# Patient Record
Sex: Female | Born: 1960 | Race: White | Hispanic: No | Marital: Single | Smoking: Never smoker
Health system: Southern US, Community
[De-identification: ages and names within clinical notes are randomized; demographics above are authoritative.]

## PROBLEM LIST (undated history)

## (undated) DIAGNOSIS — E559 Vitamin D deficiency, unspecified: Secondary | ICD-10-CM

## (undated) HISTORY — PX: APPENDECTOMY: SHX54

## (undated) HISTORY — DX: Vitamin D deficiency, unspecified: E55.9

---

## 2007-02-04 ENCOUNTER — Other Ambulatory Visit: Admission: RE | Admit: 2007-02-04 | Discharge: 2007-02-04 | Payer: Self-pay | Admitting: Obstetrics and Gynecology

## 2008-01-08 ENCOUNTER — Encounter (INDEPENDENT_AMBULATORY_CARE_PROVIDER_SITE_OTHER): Payer: Self-pay | Admitting: *Deleted

## 2008-02-05 ENCOUNTER — Other Ambulatory Visit: Admission: RE | Admit: 2008-02-05 | Discharge: 2008-02-05 | Payer: Self-pay | Admitting: Obstetrics and Gynecology

## 2008-02-22 ENCOUNTER — Ambulatory Visit: Payer: Self-pay | Admitting: Family Medicine

## 2008-02-22 DIAGNOSIS — M25559 Pain in unspecified hip: Secondary | ICD-10-CM | POA: Insufficient documentation

## 2008-04-01 ENCOUNTER — Ambulatory Visit: Payer: Self-pay | Admitting: Family Medicine

## 2008-04-01 DIAGNOSIS — D239 Other benign neoplasm of skin, unspecified: Secondary | ICD-10-CM | POA: Insufficient documentation

## 2008-06-17 ENCOUNTER — Encounter: Payer: Self-pay | Admitting: Family Medicine

## 2008-07-07 ENCOUNTER — Encounter: Payer: Self-pay | Admitting: Family Medicine

## 2009-08-29 ENCOUNTER — Encounter: Payer: Self-pay | Admitting: Family Medicine

## 2010-06-19 NOTE — Procedures (Signed)
Summary: Colonoscopy/[facility]  Colonoscopy/[facility]   Imported By: Lanelle Bal 09/06/2009 13:01:59  _____________________________________________________________________  External Attachment:    Type:   Image     Comment:   External Document

## 2011-03-21 LAB — HM PAP SMEAR

## 2013-04-21 ENCOUNTER — Encounter: Payer: Self-pay | Admitting: Obstetrics and Gynecology

## 2013-04-21 ENCOUNTER — Ambulatory Visit: Payer: Self-pay | Admitting: Obstetrics and Gynecology

## 2013-06-11 ENCOUNTER — Ambulatory Visit: Payer: Self-pay | Admitting: Obstetrics and Gynecology

## 2013-06-25 ENCOUNTER — Ambulatory Visit: Payer: Self-pay | Admitting: Obstetrics & Gynecology

## 2013-06-28 ENCOUNTER — Ambulatory Visit (INDEPENDENT_AMBULATORY_CARE_PROVIDER_SITE_OTHER): Payer: Federal, State, Local not specified - PPO | Admitting: Obstetrics and Gynecology

## 2013-06-28 ENCOUNTER — Encounter: Payer: Self-pay | Admitting: Obstetrics and Gynecology

## 2013-06-28 VITALS — BP 96/58 | HR 64 | Resp 16 | Ht 61.25 in | Wt 129.8 lb

## 2013-06-28 DIAGNOSIS — Z01419 Encounter for gynecological examination (general) (routine) without abnormal findings: Secondary | ICD-10-CM

## 2013-06-28 NOTE — Progress Notes (Signed)
GYNECOLOGY VISIT  PCP:  Dr Kelton Pillar  Referring provider:   HPI: 53 y.o.   Single  Caucasian  female   G0P0000 with Patient's last menstrual period was 07/03/2012.   here for   AEX Having aching at night in hip and knee - occurring for months.  Feels better when exercises.  Walking a mile.  Rare hot flashes and night sweats.  Less now.  Not on HRT now or in the past.   Has a healthy diet and exercises.   History of fibroids.   Hgb: PCP  Urine:PCP    GYNECOLOGIC HISTORY: Patient's last menstrual period was 07/03/2012. Sexually active:  no Partner preference: female Contraception:  none  Menopausal hormone therapy: none DES exposure: none   Blood transfusions:   none Sexually transmitted diseases: none   GYN Procedures:none   Mammogram:   Per patient 10/14 at [facility]              Pap:  03/21/11 WNL  History of abnormal pap smear:none     OB History   Grav Para Term Preterm Abortions TAB SAB Ect Mult Living   0 0 0 0 0 0 0 0 0 0        LIFESTYLE: Exercise:  Walk and resistance training             Tobacco: none Alcohol: 2-3 weekly Drug PJK:DTOI    OTHER HEALTH MAINTENANCE: Tetanus/TDap:with PCP 2013/14 Gardisil: none Influenza: never  Zostavax:never  Bone density: none Colonoscopy:2011 ? When to repeat-[facility]  Cholesterol check: 6/14 with PCP  Family History  Problem Relation Age of Onset  . Hypertension Mother   . Hypertension Father   . Thyroid disease Brother     Grave's disease  . Parkinson's disease Brother   . Heart disease Brother   . Breast cancer Mother 23    radiation    Patient Active Problem List   Diagnosis Date Noted  . MOLE 04/01/2008  . HIP PAIN, LEFT 02/22/2008   History reviewed. No pertinent past medical history.  History reviewed. No pertinent past surgical history.  ALLERGIES: Review of patient's allergies indicates no known allergies.  Current Outpatient Prescriptions  Medication Sig Dispense Refill  .  Cholecalciferol (VITAMIN D) 2000 UNITS tablet Take 2,000 Units by mouth daily.      . Omega-3 Fatty Acids (OMEGA 3 PO) Take by mouth. With CoQ10       No current facility-administered medications for this visit.     ROS:  Pertinent items are noted in HPI.  SOCIAL HISTORY:  Works in Chief Executive Officer at [employer].  Female relationship.  PHYSICAL EXAMINATION:    BP 96/58  Pulse 64  Resp 16  Ht 5' 1.25" (1.556 m)  Wt 129 lb 12.8 oz (58.877 kg)  BMI 24.32 kg/m2  LMP 07/03/2012   Wt Readings from Last 3 Encounters:  06/28/13 129 lb 12.8 oz (58.877 kg)  04/01/08 135 lb 2.1 oz (61.295 kg)  02/22/08 136 lb (61.689 kg)     Ht Readings from Last 3 Encounters:  06/28/13 5' 1.25" (1.556 m)    General appearance: alert, cooperative and appears stated age Head: Normocephalic, without obvious abnormality, atraumatic Neck: no adenopathy, supple, symmetrical, trachea midline and thyroid not enlarged, symmetric, no tenderness/mass/nodules Lungs: clear to auscultation bilaterally Breasts: Inspection negative, No nipple retraction or dimpling, No nipple discharge or bleeding, No axillary or supraclavicular adenopathy, Normal to palpation without dominant masses Heart: regular rate and rhythm Abdomen: soft, non-tender; no  masses,  no organomegaly Extremities: extremities normal, atraumatic, no cyanosis or edema Skin: Skin color, texture, turgor normal. No rashes or lesions Lymph nodes: Cervical, supraclavicular, and axillary nodes normal. No abnormal inguinal nodes palpated Neurologic: Grossly normal  Pelvic: External genitalia:  no lesions              Urethra:  normal appearing urethra with no masses, tenderness or lesions              Bartholins and Skenes: normal                 Vagina: normal appearing vagina with normal color and discharge, no lesions.  Used thin Lolita Patella.               Cervix: normal appearance              Pap and high risk HPV testing done: yes.            Bimanual  Exam:  Uterus:  uterus is normal size, shape, consistency and nontender.  Small 0.5 cm anterior LUS fibroid palpable.                                      Adnexa: normal adnexa in size, nontender and no masses                                      Rectovaginal: Confirms                                      Anus:  normal sphincter tone, no lesions  ASSESSMENT  Normal gynecologic exam. Menopausal female.  History of uterine fibroids.  Joint pain.   PLAN  Mammogram yearly.   Pap smear and high risk HPV testing performed.  Labs with PCP.  Patient will see PCP re joint pain.  Information to patient about menopause.  Return annually or prn   An After Visit Summary was printed and given to the patient.

## 2013-06-28 NOTE — Patient Instructions (Signed)

## 2013-06-30 LAB — IPS PAP TEST WITH HPV

## 2013-07-12 ENCOUNTER — Telehealth: Payer: Self-pay | Admitting: Obstetrics and Gynecology

## 2013-07-12 NOTE — Telephone Encounter (Signed)
Patient calling to review recent results.

## 2013-07-12 NOTE — Telephone Encounter (Signed)
Spoke with pt to advise her Pap was negative. Pt wasn't sure by the way it was worded, but she is appreciative.

## 2014-04-26 ENCOUNTER — Other Ambulatory Visit: Payer: Self-pay | Admitting: Physician Assistant

## 2014-04-26 ENCOUNTER — Ambulatory Visit
Admission: RE | Admit: 2014-04-26 | Discharge: 2014-04-26 | Disposition: A | Discharge status: Home / Self Care | Payer: Federal, State, Local not specified - PPO | Source: Ambulatory Visit | Attending: Physician Assistant | Admitting: Physician Assistant

## 2014-04-26 ENCOUNTER — Encounter (INDEPENDENT_AMBULATORY_CARE_PROVIDER_SITE_OTHER): Payer: Self-pay

## 2014-04-26 DIAGNOSIS — T1490XA Injury, unspecified, initial encounter: Secondary | ICD-10-CM

## 2014-12-07 ENCOUNTER — Ambulatory Visit (INDEPENDENT_AMBULATORY_CARE_PROVIDER_SITE_OTHER): Payer: Federal, State, Local not specified - PPO | Admitting: Obstetrics and Gynecology

## 2014-12-07 ENCOUNTER — Encounter: Payer: Self-pay | Admitting: Obstetrics and Gynecology

## 2014-12-07 VITALS — BP 122/60 | HR 80 | Resp 16 | Ht 61.0 in | Wt 138.0 lb

## 2014-12-07 DIAGNOSIS — Z01419 Encounter for gynecological examination (general) (routine) without abnormal findings: Secondary | ICD-10-CM

## 2014-12-07 NOTE — Patient Instructions (Signed)

## 2014-12-07 NOTE — Progress Notes (Signed)
54 y.o. G0P0000 Single Caucasian female here for annual exam.  No vaginal bleeding. Tolerable vasomotor symptoms. Not sexually active.   PCP: Kelton Pillar     Patient's last menstrual period was 07/03/2012.          Sexually active: No.  The current method of family planning is abstinence and post menopausal status.    Exercising: Yes.    walking daily Smoker:  no  Health Maintenance: Pap:  06/28/13 Neg. HR HPV:neg History of abnormal Pap:  no MMG:  04/23/14 - will get fax report  Colonoscopy: 08/29/09  Repeat 10 years  BMD:   Never  TDaP:  ~2013 with PCP - per pt Screening Labs:  Hb today: PCP, Urine today: PCP   reports that she has never smoked. She has never used smokeless tobacco. She reports that she drinks about 1.0 - 1.5 oz of alcohol per week. She reports that she does not use illicit drugs.  History reviewed. No pertinent past medical history.  History reviewed. No pertinent past surgical history.  Current Outpatient Prescriptions  Medication Sig Dispense Refill  . Cholecalciferol (VITAMIN D) 2000 UNITS tablet Take 2,000 Units by mouth daily.    . Cyanocobalamin (VITAMIN B 12 PO) Take by mouth.    . Omega-3 Fatty Acids (OMEGA 3 PO) Take by mouth. With CoQ10     No current facility-administered medications for this visit.    Family History  Problem Relation Age of Onset  . Hypertension Mother   . Hypertension Father   . Thyroid disease Brother     Grave's disease  . Parkinson's disease Brother   . Heart disease Brother   . Breast cancer Mother 42    radiation    Brother with CABG in his 48's  ROS:  Pertinent items are noted in HPI.  Otherwise, a comprehensive ROS was negative.  Exam:   BP 122/60 mmHg  Pulse 80  Resp 16  Ht 5\' 1"  (1.549 m)  Wt 138 lb (62.596 kg)  BMI 26.09 kg/m2  LMP 07/03/2012    General appearance: alert, cooperative and appears stated age Head: Normocephalic, without obvious abnormality, atraumatic Neck: no adenopathy, supple,  symmetrical, trachea midline and thyroid normal to inspection and palpation Lungs: clear to auscultation bilaterally Breasts: normal appearance, no masses or tenderness Heart: regular rate and rhythm Abdomen: soft, non-tender; bowel sounds normal; no masses,  no organomegaly Extremities: extremities normal, atraumatic, no cyanosis or edema Skin: Skin color, texture, turgor normal. No rashes or lesions Lymph nodes: Cervical, supraclavicular, and axillary nodes normal. No abnormal inguinal nodes palpated Neurologic: Grossly normal  Pelvic: External genitalia:  no lesions              Urethra:  normal appearing urethra with no masses, tenderness or lesions              Bartholins and Skenes: normal                 Vagina: atrophic vaginal mucosa              Cervix: no lesions              Pap taken: No. Bimanual Exam:  Uterus:  normal size, contour, position, consistency, mobility, non-tender and anteverted              Adnexa: normal adnexa and no mass, fullness, tenderness              Rectovaginal: Yes.  .  Confirms.  Anus:  normal sphincter tone, no lesions  Chaperone was present for exam.  Assessment:   Well woman visit with normal exam.   Plan: Yearly mammogram recommended after age 18. Due in December Recommended and reviewed self breast exam.  No pap this year Discussed Calcium, Vitamin D, regular exercise program including cardiovascular and weight bearing exercise. Labs performed.  No. Follow up annually and prn.   After visit summary provided.

## 2015-01-27 ENCOUNTER — Ambulatory Visit: Payer: Federal, State, Local not specified - PPO | Admitting: Obstetrics and Gynecology

## 2015-12-20 ENCOUNTER — Encounter: Payer: Self-pay | Admitting: Obstetrics and Gynecology

## 2015-12-20 ENCOUNTER — Ambulatory Visit: Payer: Federal, State, Local not specified - PPO | Admitting: Obstetrics and Gynecology

## 2015-12-20 VITALS — BP 100/60 | HR 80 | Resp 14 | Ht 61.0 in | Wt 131.0 lb

## 2015-12-20 DIAGNOSIS — Z01419 Encounter for gynecological examination (general) (routine) without abnormal findings: Secondary | ICD-10-CM

## 2015-12-20 NOTE — Patient Instructions (Signed)

## 2015-12-20 NOTE — Progress Notes (Signed)
55 y.o. G0P0000 SingleCaucasianF here for annual exam.  No vaginal bleeding. Not sexually active, currently. H/O female partners.    Patient's last menstrual period was 07/03/2012.          Sexually active: No.  The current method of family planning is post menopausal status.    Exercising: Yes.    walking/ boflex Smoker:  no  Health Maintenance: Pap:  06-28-13 WNL NEG HR HPV History of abnormal Pap:  no MMG:  2017 3D WNL per patient Colonoscopy: 07-18-09 WNL BMD:   Never TDaP:  2013 with her primary Gardasil: N/A   reports that she has never smoked. She has never used smokeless tobacco. She reports that she drinks about 2.4 oz of alcohol per week . 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  Mother passed in the last year. Sister in [place], brother with parkinson's also in [place].  No past medical history on file.  No past surgical history on file.  Current Outpatient Prescriptions  Medication Sig Dispense Refill  . Cholecalciferol (VITAMIN D) 2000 UNITS tablet Take 2,000 Units by mouth daily.    . Cyanocobalamin (VITAMIN B 12 PO) Take by mouth.    . Omega-3 Fatty Acids (OMEGA 3 PO) Take by mouth. With CoQ10     No current facility-administered medications for this visit.     Family History  Problem Relation Age of Onset  . Hypertension Mother   . Breast cancer Mother 90    radiation  . Hypertension Father   . Thyroid disease Brother     Grave's disease  . Parkinson's disease Brother   . Heart disease Brother     Review of Systems  Constitutional: Negative.   HENT: Negative.   Eyes: Negative.   Respiratory: Negative.   Cardiovascular: Negative.   Gastrointestinal: Negative.   Endocrine: Negative.   Genitourinary: Negative.   Musculoskeletal: Negative.   Skin: Negative.        New mole on right upper leg  Allergic/Immunologic: Negative.   Neurological: Negative.   Psychiatric/Behavioral: Negative.     Exam:   BP 100/60 (BP Location: Right Arm, Patient  Position: Sitting, Cuff Size: Normal)   Pulse 80   Resp 14   Ht 5\' 1"  (1.549 m)   Wt 131 lb (59.4 kg)   LMP 07/03/2012   BMI 24.75 kg/m   Weight change: @WEIGHTCHANGE @ Height:   Height: 5\' 1"  (154.9 cm)  Ht Readings from Last 3 Encounters:  12/20/15 5\' 1"  (1.549 m)  12/07/14 5\' 1"  (1.549 m)  06/28/13 5' 1.25" (1.556 m)    General appearance: alert, cooperative and appears stated age Head: Normocephalic, without obvious abnormality, atraumatic Neck: no adenopathy, supple, symmetrical, trachea midline and thyroid normal to inspection and palpation Lungs: clear to auscultation bilaterally Breasts: normal appearance, no masses or tenderness Heart: regular rate and rhythm Abdomen: soft, non-tender; bowel sounds normal; no masses,  no organomegaly Extremities: extremities normal, atraumatic, no cyanosis or edema Skin: Skin color, texture, turgor normal. No rashes or lesions Lymph nodes: Cervical, supraclavicular, and axillary nodes normal. No abnormal inguinal nodes palpated Neurologic: Grossly normal   Pelvic: External genitalia:  no lesions              Urethra:  normal appearing urethra with no masses, tenderness or lesions              Bartholins and Skenes: normal  Vagina: normal appearing vagina with normal color and discharge, no lesions              Cervix: no lesions               Bimanual Exam:  Uterus:  normal size, contour, position, consistency, mobility, non-tender              Adnexa: no mass, fullness, tenderness               Rectovaginal: Confirms               Anus:  normal sphincter tone, no lesions  Chaperone was present for exam.  A:  Well Woman with normal exam  New mole, will go for dermatology check  Vaginal atrophy, we discussed vaginal estrogen, she will call if she wants it (vagifem)  P:   Pap next year  Mammogram and colonoscopy are up to date  Discussed breast self awareness  Discussed calcium and vit D intake

## 2016-02-23 ENCOUNTER — Telehealth: Payer: Self-pay | Admitting: Obstetrics and Gynecology

## 2016-02-23 ENCOUNTER — Other Ambulatory Visit: Payer: Self-pay | Admitting: Obstetrics and Gynecology

## 2016-02-23 MED ORDER — ESTRADIOL 10 MCG VA TABS: ORAL_TABLET | VAGINAL | 11 refills | Status: DC

## 2016-02-23 NOTE — Telephone Encounter (Signed)
Patient calling to speak with nurse about a prescripton for hormone medication.

## 2016-02-23 NOTE — Telephone Encounter (Signed)
I sent a prescription for Vagifem to the patient's pharmacy of record.  I gave refills for a year.

## 2016-02-23 NOTE — Telephone Encounter (Signed)
Spoke with patient. Patient states that she would like to start on Vagifem at this time. Reports she spoke with Dr.Jertson about this at her aex visit in 12/2015. States that she told Dr.Jertson she would think about it and return call. Asking for rx to be sent to pharmacy on file. Advised I will speak with covering provider and return call. Patient is agreeable.  Dr.Silva, okay to send in Vagifem 10 mcg place 1 tablet vaginally daily for 2 weeks, then 1 tablet twice per week #[id number] dispense as 8 tablets after the first month?

## 2016-02-26 NOTE — Telephone Encounter (Signed)
Spoke with patient. Advised rx for Vagifem has been sent to pharmacy on file. Patient is agreeable and verbalzies understanding.  Cc: Dr.Jertson  Routing to [employer] as she sent in RX while Dr.Jertson was out. Patient agreeable to disposition. Will close encounter.

## 2016-04-07 ENCOUNTER — Observation Stay (HOSPITAL_COMMUNITY)
Admission: EM | Admit: 2016-04-07 | Discharge: 2016-04-08 | Disposition: A | Discharge status: Home / Self Care | Payer: Federal, State, Local not specified - PPO | Attending: General Surgery | Admitting: General Surgery

## 2016-04-07 ENCOUNTER — Emergency Department (HOSPITAL_COMMUNITY): Payer: Federal, State, Local not specified - PPO | Admitting: Anesthesiology

## 2016-04-07 ENCOUNTER — Encounter (HOSPITAL_COMMUNITY): Payer: Self-pay | Admitting: Emergency Medicine

## 2016-04-07 ENCOUNTER — Emergency Department (HOSPITAL_COMMUNITY): Payer: Federal, State, Local not specified - PPO

## 2016-04-07 ENCOUNTER — Encounter (HOSPITAL_COMMUNITY)
Admission: EM | Disposition: A | Discharge status: Home / Self Care | Payer: Self-pay | Source: Home / Self Care | Attending: Emergency Medicine

## 2016-04-07 DIAGNOSIS — Z79899 Other long term (current) drug therapy: Secondary | ICD-10-CM | POA: Insufficient documentation

## 2016-04-07 DIAGNOSIS — K3580 Unspecified acute appendicitis: Principal | ICD-10-CM | POA: Diagnosis present

## 2016-04-07 DIAGNOSIS — R109 Unspecified abdominal pain: Secondary | ICD-10-CM | POA: Diagnosis present

## 2016-04-07 DIAGNOSIS — Z79890 Hormone replacement therapy (postmenopausal): Secondary | ICD-10-CM | POA: Diagnosis not present

## 2016-04-07 DIAGNOSIS — K352 Acute appendicitis with generalized peritonitis, without abscess: Secondary | ICD-10-CM

## 2016-04-07 HISTORY — PX: LAPAROSCOPIC APPENDECTOMY: SHX408

## 2016-04-07 LAB — CBC
HCT: 41.6 % (ref 36.0–46.0)
Hemoglobin: 13.8 g/dL (ref 12.0–15.0)
MCH: 29.7 pg (ref 26.0–34.0)
MCHC: 33.2 g/dL (ref 30.0–36.0)
MCV: 89.5 fL (ref 78.0–100.0)
PLATELETS: 209 10*3/uL (ref 150–400)
RBC: 4.65 MIL/uL (ref 3.87–5.11)
RDW: 12.4 % (ref 11.5–15.5)
WBC: 14.1 10*3/uL — ABNORMAL HIGH (ref 4.0–10.5)

## 2016-04-07 LAB — DIFFERENTIAL
Basophils Absolute: 0 10*3/uL (ref 0.0–0.1)
Basophils Relative: 0 %
EOS PCT: 0 %
Eosinophils Absolute: 0 10*3/uL (ref 0.0–0.7)
LYMPHS PCT: 7 %
Lymphs Abs: 0.9 10*3/uL (ref 0.7–4.0)
MONO ABS: 0.7 10*3/uL (ref 0.1–1.0)
Monocytes Relative: 5 %
Neutro Abs: 12 10*3/uL — ABNORMAL HIGH (ref 1.7–7.7)
Neutrophils Relative %: 88 %

## 2016-04-07 LAB — URINALYSIS, ROUTINE W REFLEX MICROSCOPIC
BILIRUBIN URINE: NEGATIVE
GLUCOSE, UA: NEGATIVE mg/dL
HGB URINE DIPSTICK: NEGATIVE
KETONES UR: NEGATIVE mg/dL
Nitrite: NEGATIVE
PROTEIN: NEGATIVE mg/dL
Specific Gravity, Urine: 1.031 — ABNORMAL HIGH (ref 1.005–1.030)
pH: 5.5 (ref 5.0–8.0)

## 2016-04-07 LAB — COMPREHENSIVE METABOLIC PANEL
ALT: 33 U/L (ref 14–54)
AST: 49 U/L — AB (ref 15–41)
Albumin: 4.7 g/dL (ref 3.5–5.0)
Alkaline Phosphatase: 70 U/L (ref 38–126)
Anion gap: 7 (ref 5–15)
BUN: 15 mg/dL (ref 6–20)
CHLORIDE: 103 mmol/L (ref 101–111)
CO2: 25 mmol/L (ref 22–32)
CREATININE: 0.71 mg/dL (ref 0.44–1.00)
Calcium: 10 mg/dL (ref 8.9–10.3)
GFR calc Af Amer: >60 mL/min (ref >60)
GFR calc non Af Amer: >60 mL/min (ref >60)
GLUCOSE: 144 mg/dL — AB (ref 65–99)
Potassium: 4 mmol/L (ref 3.5–5.1)
SODIUM: 135 mmol/L (ref 135–145)
Total Bilirubin: 0.8 mg/dL (ref 0.3–1.2)
Total Protein: 7.6 g/dL (ref 6.5–8.1)

## 2016-04-07 LAB — URINE MICROSCOPIC-ADD ON

## 2016-04-07 LAB — LIPASE, BLOOD: LIPASE: 22 U/L (ref 11–51)

## 2016-04-07 SURGERY — APPENDECTOMY, LAPAROSCOPIC
Anesthesia: General

## 2016-04-07 MED ORDER — IOPAMIDOL (ISOVUE-300) INJECTION 61%
INTRAVENOUS | Status: AC
  Administered 2016-04-07: 21:00:00
  Filled 2016-04-07: qty 100

## 2016-04-07 MED ORDER — PROPOFOL 10 MG/ML IV BOLUS
INTRAVENOUS | Status: AC
  Filled 2016-04-07: qty 20

## 2016-04-07 MED ORDER — SODIUM CHLORIDE 0.9 % IV BOLUS (SEPSIS)
1000.0000 mL | Freq: Once | INTRAVENOUS | Status: AC
  Administered 2016-04-07: 1000 mL via INTRAVENOUS

## 2016-04-07 MED ORDER — ONDANSETRON 4 MG PO TBDP
4.0000 mg | ORAL_TABLET | Freq: Once | ORAL | Status: AC | PRN
  Administered 2016-04-07: 4 mg via ORAL
  Filled 2016-04-07: qty 1

## 2016-04-07 MED ORDER — METRONIDAZOLE IN NACL 5-0.79 MG/ML-% IV SOLN
500.0000 mg | Freq: Once | INTRAVENOUS | Status: AC
  Administered 2016-04-07: 500 mg via INTRAVENOUS
  Filled 2016-04-07: qty 100

## 2016-04-07 MED ORDER — MORPHINE SULFATE (PF) 4 MG/ML IV SOLN
4.0000 mg | INTRAVENOUS | Status: AC | PRN
  Administered 2016-04-07 (×2): 4 mg via INTRAVENOUS
  Filled 2016-04-07 (×2): qty 1

## 2016-04-07 MED ORDER — MORPHINE SULFATE (PF) 4 MG/ML IV SOLN: 4.0000 mg | Freq: Once | INTRAVENOUS | Status: DC

## 2016-04-07 MED ORDER — SUCCINYLCHOLINE CHLORIDE 200 MG/10ML IV SOSY
PREFILLED_SYRINGE | INTRAVENOUS | Status: AC
  Filled 2016-04-07: qty 10

## 2016-04-07 MED ORDER — DEXTROSE 5 % IV SOLN
2.0000 g | Freq: Once | INTRAVENOUS | Status: AC
  Administered 2016-04-07: 2 g via INTRAVENOUS
  Filled 2016-04-07: qty 2

## 2016-04-07 MED ORDER — SODIUM CHLORIDE 0.9 % IV SOLN
INTRAVENOUS | Status: DC | PRN
  Administered 2016-04-07: 20:00:00 via INTRAVENOUS

## 2016-04-07 MED ORDER — BUPIVACAINE HCL (PF) 0.5 % IJ SOLN
INTRAMUSCULAR | Status: AC
  Filled 2016-04-07: qty 30

## 2016-04-07 MED ORDER — LIDOCAINE 2% (20 MG/ML) 5 ML SYRINGE
INTRAMUSCULAR | Status: AC
  Filled 2016-04-07: qty 5

## 2016-04-07 MED ORDER — IOPAMIDOL (ISOVUE-300) INJECTION 61%
100.0000 mL | Freq: Once | INTRAVENOUS | Status: AC | PRN
  Administered 2016-04-07: 100 mL via INTRAVENOUS

## 2016-04-07 MED ORDER — MIDAZOLAM HCL 2 MG/2ML IJ SOLN
INTRAMUSCULAR | Status: AC
  Filled 2016-04-07: qty 2

## 2016-04-07 MED ORDER — ROCURONIUM BROMIDE 50 MG/5ML IV SOSY
PREFILLED_SYRINGE | INTRAVENOUS | Status: AC
  Filled 2016-04-07: qty 5

## 2016-04-07 MED ORDER — FENTANYL CITRATE (PF) 100 MCG/2ML IJ SOLN
INTRAMUSCULAR | Status: AC
  Filled 2016-04-07: qty 2

## 2016-04-07 MED ORDER — METRONIDAZOLE IN NACL 5-0.79 MG/ML-% IV SOLN
INTRAVENOUS | Status: AC
  Filled 2016-04-07: qty 100

## 2016-04-07 SURGICAL SUPPLY — 45 items
APPLIER CLIP 5 13 M/L LIGAMAX5 (MISCELLANEOUS) ×3
APPLIER CLIP ROT 10 11.4 M/L (STAPLE)
BENZOIN TINCTURE PRP APPL 2/3 (GAUZE/BANDAGES/DRESSINGS) ×3 IMPLANT
CABLE HIGH FREQUENCY MONO STRZ (ELECTRODE) ×3 IMPLANT
CHLORAPREP W/TINT 26ML (MISCELLANEOUS) ×3 IMPLANT
CLIP APPLIE 5 13 M/L LIGAMAX5 (MISCELLANEOUS) ×1 IMPLANT
CLIP APPLIE ROT 10 11.4 M/L (STAPLE) IMPLANT
CLOSURE WOUND 1/2 X4 (GAUZE/BANDAGES/DRESSINGS) ×1
COVER SURGICAL LIGHT HANDLE (MISCELLANEOUS) ×3 IMPLANT
CUTTER FLEX LINEAR 45M (STAPLE) ×3 IMPLANT
DECANTER SPIKE VIAL GLASS SM (MISCELLANEOUS) ×3 IMPLANT
DRAIN CHANNEL 19F RND (DRAIN) IMPLANT
DRAPE LAPAROSCOPIC ABDOMINAL (DRAPES) ×3 IMPLANT
DRSG TEGADERM 2-3/8X2-3/4 SM (GAUZE/BANDAGES/DRESSINGS) ×3 IMPLANT
ELECT REM PT RETURN 9FT ADLT (ELECTROSURGICAL) ×3
ELECTRODE REM PT RTRN 9FT ADLT (ELECTROSURGICAL) ×1 IMPLANT
ENDOLOOP SUT PDS II  0 18 (SUTURE)
ENDOLOOP SUT PDS II 0 18 (SUTURE) IMPLANT
EVACUATOR SILICONE 100CC (DRAIN) IMPLANT
GAUZE SPONGE 2X2 8PLY STRL LF (GAUZE/BANDAGES/DRESSINGS) ×1 IMPLANT
GLOVE ECLIPSE 8.0 STRL XLNG CF (GLOVE) ×3 IMPLANT
GLOVE INDICATOR 8.0 STRL GRN (GLOVE) ×3 IMPLANT
GOWN STRL REUS W/TWL XL LVL3 (GOWN DISPOSABLE) ×6 IMPLANT
HEMOSTAT SNOW SURGICEL 2X4 (HEMOSTASIS) ×3 IMPLANT
IRRIG SUCT STRYKERFLOW 2 WTIP (MISCELLANEOUS) ×3
IRRIGATION SUCT STRKRFLW 2 WTP (MISCELLANEOUS) ×1 IMPLANT
KIT BASIN OR (CUSTOM PROCEDURE TRAY) ×3 IMPLANT
POUCH SPECIMEN RETRIEVAL 10MM (ENDOMECHANICALS) ×3 IMPLANT
RELOAD 45 VASCULAR/THIN (ENDOMECHANICALS) IMPLANT
RELOAD STAPLE TA45 3.5 REG BLU (ENDOMECHANICALS) ×3 IMPLANT
SCISSORS LAP 5X35 DISP (ENDOMECHANICALS) ×3 IMPLANT
SHEARS HARMONIC ACE PLUS 36CM (ENDOMECHANICALS) ×3 IMPLANT
SLEEVE XCEL OPT CAN 5 100 (ENDOMECHANICALS) ×3 IMPLANT
SPONGE GAUZE 2X2 STER 10/PKG (GAUZE/BANDAGES/DRESSINGS) ×2
STRIP CLOSURE SKIN 1/2X4 (GAUZE/BANDAGES/DRESSINGS) ×2 IMPLANT
SUT ETHILON 3 0 PS 1 (SUTURE) IMPLANT
SUT MNCRL AB 4-0 PS2 18 (SUTURE) ×3 IMPLANT
TOWEL OR 17X26 10 PK STRL BLUE (TOWEL DISPOSABLE) ×3 IMPLANT
TOWEL OR NON WOVEN STRL DISP B (DISPOSABLE) ×3 IMPLANT
TRAY FOLEY W/METER SILVER 14FR (SET/KITS/TRAYS/PACK) ×3 IMPLANT
TRAY FOLEY W/METER SILVER 16FR (SET/KITS/TRAYS/PACK) IMPLANT
TRAY LAPAROSCOPIC (CUSTOM PROCEDURE TRAY) ×3 IMPLANT
TROCAR BLADELESS OPT 5 100 (ENDOMECHANICALS) ×3 IMPLANT
TROCAR XCEL BLUNT TIP 100MML (ENDOMECHANICALS) ×3 IMPLANT
TUBING INSUF HEATED (TUBING) ×3 IMPLANT

## 2016-04-07 NOTE — ED Notes (Signed)
Dr. Zella Richer, general surgeon, at bedside.

## 2016-04-07 NOTE — ED Provider Notes (Signed)
[place] DEPT Provider Note   CSN: [id number] Arrival date & time: 04/07/16  [place]     History   Chief Complaint Chief Complaint  Patient presents with  . Abdominal Pain    HPI Crystal Mcguire is a 55 y.o. female.  HPI 55 year old female who presents with abdominal pain. She is otherwise healthy and has no prior abdominal surgeries. States sudden onset of left upper quadrant abdominal pain at around 1 PM today at rest. Since then pain has been constant, worsening, and now traveling diffusely across her abdomen and now into the left lower quadrant. Has had multiple episodes of nonbilious and nonbloody emesis. No diarrhea or constipation. No dysuria, urinary frequency or hematuria. With subjective fevers and chills. Has not had pain like this in the past. No prior history of post prandial abdominal pain. She did not take any medications and denies any NSAID usage. History reviewed. No pertinent past medical history.  Patient Active Problem List   Diagnosis Date Noted  . MOLE 04/01/2008    History reviewed. No pertinent surgical history.  OB History    Gravida Para Term Preterm AB Living   0 0 0 0 0 0   SAB TAB Ectopic Multiple Live Births   0 0 0 0         Home Medications    Prior to Admission medications   Medication Sig Start Date End Date Taking? Authorizing Provider  Cyanocobalamin (VITAMIN B 12 PO) Take 1 tablet by mouth daily.    Yes Historical Provider, MD  Estradiol 10 MCG TABS vaginal tablet Place one tablet (10 mcg) in vagina at hs for 2 weeks. Then place one tablet in vagina at hs twice a week. Patient taking differently: Place one tablet (10 mcg) in vagina at hs for 2 weeks. Then place one tablet (38mcg) in vagina at hs twice a week. 02/23/16  Yes [place], MD  Omega-3 Fatty Acids (OMEGA 3 PO) Take 1 tablet by mouth daily. With CoQ10   Yes Historical Provider, MD  Vitamin D, Ergocalciferol, (DRISDOL) 50000 units CAPS capsule Take 5,000  Units by mouth every 7 (seven) days.  03/31/16  Yes Historical Provider, MD    Family History Family History  Problem Relation Age of Onset  . Hypertension Mother   . Breast cancer Mother 56    radiation  . Hypertension Father   . Thyroid disease Brother     Grave's disease  . Parkinson's disease Brother   . Heart disease Brother     Social History Social History  Substance Use Topics  . Smoking status: Never Smoker  . Smokeless tobacco: Never Used  . Alcohol use 2.4 oz/week    4 Standard drinks or equivalent per week     Allergies   Patient has no known allergies.   Review of Systems Review of Systems 10/14 systems reviewed and are negative other than those stated in the HPI   Physical Exam Updated Vital Signs BP 109/64   Pulse 106   Temp (S) 100.6 F (38.1 C) (Oral)   Resp 18   Ht 5\' 5"  (1.651 m)   Wt 131 lb (59.4 kg)   LMP 07/03/2012   SpO2 95%   BMI 21.80 kg/m   Physical Exam Physical Exam  Nursing note and vitals reviewed. Constitutional: non-toxic, and in no acute distress Head: Normocephalic and atraumatic.  Mouth/Throat: Oropharynx is clear and moist.  Neck: Normal range of motion. Neck supple.  Cardiovascular: Tachycardic rate  and regular rhythm.   Pulmonary/Chest: Effort normal and breath sounds normal.  Abdominal: Soft. No distension. Diffusely tenderness, worse in left hemi-abdomen. There is guarding. No rebound. Musculoskeletal: Normal range of motion.  Neurological: Alert, no facial droop, fluent speech, moves all extremities symmetrically Skin: Skin is warm and dry.  Psychiatric: Cooperative   ED Treatments / Results  Labs (all labs ordered are listed, but only abnormal results are displayed) Labs Reviewed  COMPREHENSIVE METABOLIC PANEL - Abnormal; Notable for the following:       Result Value   Glucose, Bld 144 (*)    AST 49 (*)    All other components within normal limits  CBC - Abnormal; Notable for the following:    WBC  14.1 (*)    All other components within normal limits  URINALYSIS, ROUTINE W REFLEX MICROSCOPIC (NOT AT [facility]) - Abnormal; Notable for the following:    Specific Gravity, Urine 1.031 (*)    Leukocytes, UA SMALL (*)    All other components within normal limits  DIFFERENTIAL - Abnormal; Notable for the following:    Neutro Abs 12.0 (*)    All other components within normal limits  URINE MICROSCOPIC-ADD ON - Abnormal; Notable for the following:    Squamous Epithelial / LPF 6-30 (*)    Bacteria, UA FEW (*)    All other components within normal limits  LIPASE, BLOOD    EKG  EKG Interpretation None       Radiology Ct Abdomen Pelvis W Contrast  Result Date: 04/07/2016 CLINICAL DATA:  55 year old female with abdominal pain since noon. Nausea and vomiting. Initial encounter. EXAM: CT ABDOMEN AND PELVIS WITH CONTRAST TECHNIQUE: Multidetector CT imaging of the abdomen and pelvis was performed using the standard protocol following bolus administration of intravenous contrast. CONTRAST:  185mL ISOVUE-300 IOPAMIDOL (ISOVUE-300) INJECTION 61% COMPARISON:  None. FINDINGS: Lower chest: No pericardial or pleural effusion. Mild right lung base atelectasis. No upper abdominal free air. Hepatobiliary: Negative aside from borderline to mild periportal edema. Pancreas: Negative. Spleen: Negative; several subtle sub cm low-density areas occasionally noted including series 2, image 20, which appear benign and inconsequential. Adrenals/Urinary Tract: Negative adrenal glands. Bilateral renal enhancement and contrast excretion is normal. Unremarkable urinary bladder. Stomach/Bowel: Negative rectum. Distal sigmoid colon is decompressed. Redundant proximal sigmoid mildly gas distended. Decompressed descending colon. Redundant gas-filled splenic flexure and transverse colon. Gas and stool in the right colon. The cecum is located in the pelvis near midline. Negative terminal ileum. It appears the appendix arises along the  posterior wall of the cecum projecting toward the sacrum. There might be small isodense fecaliths at the base of the appendix (coronal image 56) while the more distal appendix appears dilated up to 12 mm and contains a combination of gas and fluid. The appendix is inseparable here from the distal sigmoid colon. There might be trace free fluid along the course of the appendix. No definite extraluminal gas. No pneumoperitoneum. The distal small bowel is decompressed. There is trace oral contrast mixed with gas in the proximal small bowel, some of which is mildly prominent. Small volume fluid in the stomach. Decompressed duodenum. Vascular/Lymphatic: Mild Calcified aortic atherosclerosis. Major arterial structures are patent. Portal venous system is patent. No lymphadenopathy. Reproductive: Multiple rounded uterine masses ranging from 2.5-4.1 cm. The largest located in the lower uterine segment has speckled calcification, and most likely these are multiple intramural fibroids. Negative adnexa. Other: No pelvic free fluid. Musculoskeletal: Lumbosacral junction disc and endplate degeneration. No acute osseous abnormality  identified. IMPRESSION: 1. Suspicion of early acute appendicitis with abnormal appearance of the midportion of the appendix which appears dilated with possible trace free fluid. Note that the appendix arises along the posterior wall of the cecum which is located near midline in the pelvis. The midportion of the appendix is inseparable from the distal sigmoid colon. If the clinical course is equivocal then administration of oral contrast and repeat CT abdomen and pelvis (IV contrast preferred) in 12 hours would be recommended. 2. No other acute or inflammatory process identified in the abdomen or pelvis. 3. Fibroid uterus. Electronically Signed   By: Genevie Ann M.D.   On: 04/07/2016 21:23    Procedures Procedures (including critical care time)  Medications Ordered in ED Medications  morphine 4 MG/ML  injection 4 mg (4 mg Intravenous Given 04/07/16 2033)  cefTRIAXone (ROCEPHIN) 2 g in dextrose 5 % 50 mL IVPB (2 g Intravenous New Bag/Given 04/07/16 2200)    And  metroNIDAZOLE (FLAGYL) IVPB 500 mg (not administered)  ondansetron (ZOFRAN-ODT) disintegrating tablet 4 mg (4 mg Oral Given 04/07/16 1855)  sodium chloride 0.9 % bolus 1,000 mL (0 mLs Intravenous Stopped 04/07/16 2206)  iopamidol (ISOVUE-300) 61 % injection 100 mL (100 mLs Intravenous Contrast Given 04/07/16 2051)  iopamidol (ISOVUE-300) 61 % injection (  Contrast Given 04/07/16 2046)     Initial Impression / Assessment and Plan / ED Course  I have reviewed the triage vital signs and the nursing notes.  Pertinent labs & imaging results that were available during my care of the patient were reviewed by me and considered in my medical decision making (see chart for details).  Clinical Course     55 year old female who presents with generalized abdominal pain, nausea and vomiting. Despite fever 100.6 Fahrenheit here in the ED, is tachycardic. Normotensive and is overall nontoxic in appearance. Diffusely tender on abdominal exam with guarding. CT abdomen and pelvis obtained, visualized, and shows evidence of mild early appendicitis. With leukocytosis of 14 on blood work. Started on ceftriaxone and Flagyl. Discussed with Dr. Zella Richer who will admit for ongoing management.  Final Clinical Impressions(s) / ED Diagnoses   Final diagnoses:  Acute appendicitis with generalized peritonitis    New Prescriptions New Prescriptions   No medications on file     Forde Dandy, MD 04/07/16 2218

## 2016-04-07 NOTE — H&P (Signed)
Crystal Mcguire is an 55 y.o. female.   Chief Complaint:   Abdominal pain HPI:  She was in her usual state of health until about 12:30 today when she developed some epigastric pain. She tried some Pepto-Bismol which did not help the pain. The pain then radiated down to the lower abdomen and worsened. She did have some vomiting and nausea at home. She presented to the emergency department for evaluation. She was noted to have a leukocytosis. CT scan demonstrated findings concerning for acute appendicitis with a dilated appendix with some surrounding free fluid. We were asked to see her because of this. She is otherwise healthy.  History reviewed. No pertinent past medical history.  History reviewed. No pertinent surgical history.  Family History  Problem Relation Age of Onset  . Hypertension Mother   . Breast cancer Mother 26    radiation  . Hypertension Father   . Thyroid disease Brother     Grave's disease  . Parkinson's disease Brother   . Heart disease Brother    Social History:  reports that she has never smoked. She has never used smokeless tobacco. She reports that she drinks about 2.4 oz of alcohol per week . She reports that she does not use drugs.  Allergies: No Known Allergies  Prior to Admission medications   Medication Sig Start Date End Date Taking? Authorizing Provider  Cyanocobalamin (VITAMIN B 12 PO) Take 1 tablet by mouth daily.    Yes Historical Provider, MD  Estradiol 10 MCG TABS vaginal tablet Place one tablet (10 mcg) in vagina at hs for 2 weeks. Then place one tablet in vagina at hs twice a week. Patient taking differently: Place one tablet (10 mcg) in vagina at hs for 2 weeks. Then place one tablet (51mg) in vagina at hs twice a week. 02/23/16  Yes BDavison MD  Omega-3 Fatty Acids (OMEGA 3 PO) Take 1 tablet by mouth daily. With CoQ10   Yes Historical Provider, MD  Vitamin D, Ergocalciferol, (DRISDOL) 50000 units CAPS capsule Take 5,000 Units by  mouth every 7 (seven) days.  03/31/16  Yes Historical Provider, MD     Results for orders placed or performed during the hospital encounter of 04/07/16 (from the past 48 hour(s))  Lipase, blood     Status: None   Collection Time: 04/07/16  7:02 PM  Result Value Ref Range   Lipase 22 11 - 51 U/L  Comprehensive metabolic panel     Status: Abnormal   Collection Time: 04/07/16  7:02 PM  Result Value Ref Range   Sodium 135 135 - 145 mmol/L   Potassium 4.0 3.5 - 5.1 mmol/L   Chloride 103 101 - 111 mmol/L   CO2 25 22 - 32 mmol/L   Glucose, Bld 144 (H) 65 - 99 mg/dL   BUN 15 6 - 20 mg/dL   Creatinine, Ser 0.71 0.44 - 1.00 mg/dL   Calcium 10.0 8.9 - 10.3 mg/dL   Total Protein 7.6 6.5 - 8.1 g/dL   Albumin 4.7 3.5 - 5.0 g/dL   AST 49 (H) 15 - 41 U/L   ALT 33 14 - 54 U/L   Alkaline Phosphatase 70 38 - 126 U/L   Total Bilirubin 0.8 0.3 - 1.2 mg/dL   GFR calc non Af Amer >60 >60 mL/min   GFR calc Af Amer >60 >60 mL/min    Comment: (NOTE) The eGFR has been calculated using the CKD EPI equation. This calculation has not been  validated in all clinical situations. eGFR's persistently <60 mL/min signify possible Chronic Kidney Disease.    Anion gap 7 5 - 15  CBC     Status: Abnormal   Collection Time: 04/07/16  7:02 PM  Result Value Ref Range   WBC 14.1 (H) 4.0 - 10.5 K/uL   RBC 4.65 3.87 - 5.11 MIL/uL   Hemoglobin 13.8 12.0 - 15.0 g/dL   HCT 41.6 36.0 - 46.0 %   MCV 89.5 78.0 - 100.0 fL   MCH 29.7 26.0 - 34.0 pg   MCHC 33.2 30.0 - 36.0 g/dL   RDW 12.4 11.5 - 15.5 %   Platelets 209 150 - 400 K/uL  Differential     Status: Abnormal   Collection Time: 04/07/16  7:02 PM  Result Value Ref Range   Neutrophils Relative % 88 %   Neutro Abs 12.0 (H) 1.7 - 7.7 K/uL   Lymphocytes Relative 7 %   Lymphs Abs 0.9 0.7 - 4.0 K/uL   Monocytes Relative 5 %   Monocytes Absolute 0.7 0.1 - 1.0 K/uL   Eosinophils Relative 0 %   Eosinophils Absolute 0.0 0.0 - 0.7 K/uL   Basophils Relative 0 %    Basophils Absolute 0.0 0.0 - 0.1 K/uL  Urinalysis, Routine w reflex microscopic     Status: Abnormal   Collection Time: 04/07/16  9:04 PM  Result Value Ref Range   Color, Urine YELLOW YELLOW   APPearance CLEAR CLEAR   Specific Gravity, Urine 1.031 (H) 1.005 - 1.030   pH 5.5 5.0 - 8.0   Glucose, UA NEGATIVE NEGATIVE mg/dL   Hgb urine dipstick NEGATIVE NEGATIVE   Bilirubin Urine NEGATIVE NEGATIVE   Ketones, ur NEGATIVE NEGATIVE mg/dL   Protein, ur NEGATIVE NEGATIVE mg/dL   Nitrite NEGATIVE NEGATIVE   Leukocytes, UA SMALL (A) NEGATIVE  Urine microscopic-add on     Status: Abnormal   Collection Time: 04/07/16  9:04 PM  Result Value Ref Range   Squamous Epithelial / LPF 6-30 (A) NONE SEEN   WBC, UA 6-30 0 - 5 WBC/hpf   RBC / HPF 0-5 0 - 5 RBC/hpf   Bacteria, UA FEW (A) NONE SEEN   Ct Abdomen Pelvis W Contrast  Result Date: 04/07/2016 CLINICAL DATA:  55 year old female with abdominal pain since noon. Nausea and vomiting. Initial encounter. EXAM: CT ABDOMEN AND PELVIS WITH CONTRAST TECHNIQUE: Multidetector CT imaging of the abdomen and pelvis was performed using the standard protocol following bolus administration of intravenous contrast. CONTRAST:  136m ISOVUE-300 IOPAMIDOL (ISOVUE-300) INJECTION 61% COMPARISON:  None. FINDINGS: Lower chest: No pericardial or pleural effusion. Mild right lung base atelectasis. No upper abdominal free air. Hepatobiliary: Negative aside from borderline to mild periportal edema. Pancreas: Negative. Spleen: Negative; several subtle sub cm low-density areas occasionally noted including series 2, image 20, which appear benign and inconsequential. Adrenals/Urinary Tract: Negative adrenal glands. Bilateral renal enhancement and contrast excretion is normal. Unremarkable urinary bladder. Stomach/Bowel: Negative rectum. Distal sigmoid colon is decompressed. Redundant proximal sigmoid mildly gas distended. Decompressed descending colon. Redundant gas-filled splenic flexure  and transverse colon. Gas and stool in the right colon. The cecum is located in the pelvis near midline. Negative terminal ileum. It appears the appendix arises along the posterior wall of the cecum projecting toward the sacrum. There might be small isodense fecaliths at the base of the appendix (coronal image 56) while the more distal appendix appears dilated up to 12 mm and contains a combination of gas and fluid. The  appendix is inseparable here from the distal sigmoid colon. There might be trace free fluid along the course of the appendix. No definite extraluminal gas. No pneumoperitoneum. The distal small bowel is decompressed. There is trace oral contrast mixed with gas in the proximal small bowel, some of which is mildly prominent. Small volume fluid in the stomach. Decompressed duodenum. Vascular/Lymphatic: Mild Calcified aortic atherosclerosis. Major arterial structures are patent. Portal venous system is patent. No lymphadenopathy. Reproductive: Multiple rounded uterine masses ranging from 2.5-4.1 cm. The largest located in the lower uterine segment has speckled calcification, and most likely these are multiple intramural fibroids. Negative adnexa. Other: No pelvic free fluid. Musculoskeletal: Lumbosacral junction disc and endplate degeneration. No acute osseous abnormality identified. IMPRESSION: 1. Suspicion of early acute appendicitis with abnormal appearance of the midportion of the appendix which appears dilated with possible trace free fluid. Note that the appendix arises along the posterior wall of the cecum which is located near midline in the pelvis. The midportion of the appendix is inseparable from the distal sigmoid colon. If the clinical course is equivocal then administration of oral contrast and repeat CT abdomen and pelvis (IV contrast preferred) in 12 hours would be recommended. 2. No other acute or inflammatory process identified in the abdomen or pelvis. 3. Fibroid uterus. Electronically  Signed   By: Genevie Ann M.D.   On: 04/07/2016 21:23    Review of Systems  Constitutional: Positive for chills and fever. Negative for weight loss.  HENT: Negative for congestion, sinus pain and sore throat.   Respiratory: Negative for shortness of breath.   Cardiovascular: Negative for chest pain.  Gastrointestinal: Positive for abdominal pain and nausea. Negative for blood in stool.  Genitourinary: Negative for dysuria and hematuria.  Musculoskeletal: Negative for back pain.  Neurological: Negative for focal weakness and seizures.  Endo/Heme/Allergies:       No bleeding disorders.    Blood pressure 107/62, pulse 100, temperature (S) 100.6 F (38.1 C), temperature source Oral, resp. rate 18, height _0  (1.651 m), weight 59.4 kg (131 lb), last menstrual period 07/03/2012, SpO2 92 %. Physical Exam  Constitutional: She appears well-developed and well-nourished. No distress.  HENT:  Head: Normocephalic and atraumatic.  Eyes: EOM are normal. Scleral icterus is present.  Neck: Neck supple.  No mass  Cardiovascular:  Increased rate, regular rhythm.  Respiratory: Effort normal.  GI: Soft. She exhibits no mass. There is tenderness (lower midline and right lower quadr). There is no guarding.  Musculoskeletal: She exhibits no edema or deformity.  Neurological: She is alert.  Skin: She is not diaphoretic.  Increased warmth  Psychiatric: She has a normal mood and affect. Her behavior is normal.     Assessment/Plan Acute appendicitis-she has received intravenous antibiotics in the emergency room.  Plan laparoscopic possible open appendectomy. I have discussed the procedure and risks of appendectomy. The risks include but are not limited to bleeding, infection, wound problems, anesthesia, injury to intra-abdominal organs, possibility of postoperative ileus. She seems to understand and agrees with the plan.  Odis Hollingshead, MD 04/07/2016, 11:02 PM

## 2016-04-07 NOTE — Anesthesia Preprocedure Evaluation (Signed)
Anesthesia Evaluation  Patient identified by MRN, date of birth, ID band Patient awake    Reviewed: Allergy & Precautions, NPO status , Patient's Chart, lab work & pertinent test results  Airway Mallampati: II  TM Distance: >3 FB Neck ROM: Full    Dental no notable dental hx.    Pulmonary neg pulmonary ROS,    Pulmonary exam normal breath sounds clear to auscultation       Cardiovascular negative cardio ROS Normal cardiovascular exam Rhythm:Regular Rate:Normal     Neuro/Psych negative neurological ROS  negative psych ROS   GI/Hepatic negative GI ROS, Neg liver ROS,   Endo/Other  negative endocrine ROS  Renal/GU negative Renal ROS  negative genitourinary   Musculoskeletal negative musculoskeletal ROS (+)   Abdominal   Peds negative pediatric ROS (+)  Hematology negative hematology ROS (+)   Anesthesia Other Findings   Reproductive/Obstetrics negative OB ROS                             Anesthesia Physical Anesthesia Plan  ASA: I and emergent  Anesthesia Plan: General   Post-op Pain Management:    Induction: Intravenous, Rapid sequence and Cricoid pressure planned  Airway Management Planned: Oral ETT  Additional Equipment:   Intra-op Plan:   Post-operative Plan: Extubation in OR  Informed Consent: I have reviewed the patients History and Physical, chart, labs and discussed the procedure including the risks, benefits and alternatives for the proposed anesthesia with the patient or authorized representative who has indicated his/her understanding and acceptance.   Dental advisory given  Plan Discussed with: CRNA  Anesthesia Plan Comments:         Anesthesia Quick Evaluation

## 2016-04-07 NOTE — ED Triage Notes (Signed)
Patient reports abdominal pain starting around noon. Reports nausea, vomiting. Denies diarrhea. Patient took pepto bismol at home without relief.

## 2016-04-08 ENCOUNTER — Encounter (HOSPITAL_COMMUNITY): Payer: Self-pay | Admitting: General Surgery

## 2016-04-08 ENCOUNTER — Encounter (INDEPENDENT_AMBULATORY_CARE_PROVIDER_SITE_OTHER): Payer: Self-pay | Admitting: General Surgery

## 2016-04-08 DIAGNOSIS — K3580 Unspecified acute appendicitis: Secondary | ICD-10-CM | POA: Diagnosis present

## 2016-04-08 MED ORDER — PHENYLEPHRINE 40 MCG/ML (10ML) SYRINGE FOR IV PUSH (FOR BLOOD PRESSURE SUPPORT)
PREFILLED_SYRINGE | INTRAVENOUS | Status: AC
  Filled 2016-04-08: qty 10

## 2016-04-08 MED ORDER — SUGAMMADEX SODIUM 200 MG/2ML IV SOLN
INTRAVENOUS | Status: DC | PRN
  Administered 2016-04-08: 200 mg via INTRAVENOUS

## 2016-04-08 MED ORDER — HYDROCODONE-ACETAMINOPHEN 5-325 MG PO TABS: 1.0000 | ORAL_TABLET | ORAL | Status: DC | PRN

## 2016-04-08 MED ORDER — MORPHINE SULFATE (PF) 10 MG/ML IV SOLN: 2.0000 mg | INTRAVENOUS | Status: DC | PRN

## 2016-04-08 MED ORDER — PROPOFOL 10 MG/ML IV BOLUS
INTRAVENOUS | Status: DC | PRN
  Administered 2016-04-07: 160 mg via INTRAVENOUS

## 2016-04-08 MED ORDER — PHENYLEPHRINE HCL 10 MG/ML IJ SOLN
INTRAMUSCULAR | Status: DC | PRN
  Administered 2016-04-08 (×2): 40 ug via INTRAVENOUS

## 2016-04-08 MED ORDER — LIDOCAINE 2% (20 MG/ML) 5 ML SYRINGE
INTRAMUSCULAR | Status: DC | PRN
  Administered 2016-04-07: 100 mg via INTRAVENOUS

## 2016-04-08 MED ORDER — BUPIVACAINE HCL (PF) 0.5 % IJ SOLN
INTRAMUSCULAR | Status: DC | PRN
  Administered 2016-04-08: 30 mL

## 2016-04-08 MED ORDER — PROMETHAZINE HCL 25 MG/ML IJ SOLN: 6.2500 mg | INTRAMUSCULAR | Status: DC | PRN

## 2016-04-08 MED ORDER — MEPERIDINE HCL 50 MG/ML IJ SOLN: 6.2500 mg | INTRAMUSCULAR | Status: DC | PRN

## 2016-04-08 MED ORDER — SUCCINYLCHOLINE CHLORIDE 200 MG/10ML IV SOSY
PREFILLED_SYRINGE | INTRAVENOUS | Status: DC | PRN
  Administered 2016-04-07: 100 mg via INTRAVENOUS

## 2016-04-08 MED ORDER — HYDROMORPHONE HCL 1 MG/ML IJ SOLN: 0.2500 mg | INTRAMUSCULAR | Status: DC | PRN

## 2016-04-08 MED ORDER — MIDAZOLAM HCL 5 MG/5ML IJ SOLN
INTRAMUSCULAR | Status: DC | PRN
  Administered 2016-04-07: 2 mg via INTRAVENOUS

## 2016-04-08 MED ORDER — KCL IN DEXTROSE-NACL 20-5-0.9 MEQ/L-%-% IV SOLN
INTRAVENOUS | Status: DC
  Administered 2016-04-08: 100 mL/h via INTRAVENOUS
  Filled 2016-04-08 (×2): qty 1000

## 2016-04-08 MED ORDER — LACTATED RINGERS IR SOLN
Status: DC | PRN
  Administered 2016-04-08: 3000 mL

## 2016-04-08 MED ORDER — ACETAMINOPHEN 500 MG PO TABS
500.0000 mg | ORAL_TABLET | Freq: Four times a day (QID) | ORAL | Status: DC | PRN
  Filled 2016-04-08: qty 1

## 2016-04-08 MED ORDER — ONDANSETRON HCL 4 MG/2ML IJ SOLN: 4.0000 mg | INTRAMUSCULAR | Status: DC | PRN

## 2016-04-08 MED ORDER — FENTANYL CITRATE (PF) 100 MCG/2ML IJ SOLN
INTRAMUSCULAR | Status: AC
  Filled 2016-04-08: qty 2

## 2016-04-08 MED ORDER — ONDANSETRON 4 MG PO TBDP
4.0000 mg | ORAL_TABLET | Freq: Four times a day (QID) | ORAL | Status: DC | PRN
  Filled 2016-04-08: qty 1

## 2016-04-08 MED ORDER — ONDANSETRON HCL 4 MG/2ML IJ SOLN
INTRAMUSCULAR | Status: AC
  Filled 2016-04-08: qty 2

## 2016-04-08 MED ORDER — ROCURONIUM BROMIDE 10 MG/ML (PF) SYRINGE
PREFILLED_SYRINGE | INTRAVENOUS | Status: DC | PRN
  Administered 2016-04-07: 40 mg via INTRAVENOUS

## 2016-04-08 MED ORDER — DEXAMETHASONE SODIUM PHOSPHATE 10 MG/ML IJ SOLN
INTRAMUSCULAR | Status: DC | PRN
  Administered 2016-04-08: 10 mg via INTRAVENOUS

## 2016-04-08 MED ORDER — FENTANYL CITRATE (PF) 100 MCG/2ML IJ SOLN
INTRAMUSCULAR | Status: DC | PRN
  Administered 2016-04-07 – 2016-04-08 (×2): 50 ug via INTRAVENOUS
  Administered 2016-04-08: 100 ug via INTRAVENOUS

## 2016-04-08 MED ORDER — DEXAMETHASONE SODIUM PHOSPHATE 10 MG/ML IJ SOLN
INTRAMUSCULAR | Status: AC
  Filled 2016-04-08: qty 1

## 2016-04-08 MED ORDER — ONDANSETRON HCL 4 MG/2ML IJ SOLN
INTRAMUSCULAR | Status: DC | PRN
  Administered 2016-04-08: 4 mg via INTRAVENOUS

## 2016-04-08 MED ORDER — INFLUENZA VAC SPLIT QUAD 0.5 ML IM SUSY: 0.5000 mL | PREFILLED_SYRINGE | INTRAMUSCULAR | Status: DC

## 2016-04-08 MED ORDER — HEPARIN SODIUM (PORCINE) 5000 UNIT/ML IJ SOLN: 5000.0000 [IU] | Freq: Three times a day (TID) | INTRAMUSCULAR | Status: DC

## 2016-04-08 MED ORDER — SUGAMMADEX SODIUM 200 MG/2ML IV SOLN
INTRAVENOUS | Status: AC
  Filled 2016-04-08: qty 2

## 2016-04-08 MED ORDER — DEXTROSE 5 % IV SOLN
2.0000 g | INTRAVENOUS | Status: DC
  Filled 2016-04-08: qty 2

## 2016-04-08 MED ORDER — LACTATED RINGERS IV SOLN
INTRAVENOUS | Status: DC | PRN
  Administered 2016-04-08: via INTRAVENOUS

## 2016-04-08 MED ORDER — METRONIDAZOLE IN NACL 5-0.79 MG/ML-% IV SOLN
500.0000 mg | Freq: Three times a day (TID) | INTRAVENOUS | Status: DC
  Administered 2016-04-08: 500 mg via INTRAVENOUS
  Filled 2016-04-08 (×3): qty 100

## 2016-04-08 MED ORDER — HYDROCODONE-ACETAMINOPHEN 5-325 MG PO TABS: 1.0000 | ORAL_TABLET | ORAL | 0 refills | Status: DC | PRN

## 2016-04-08 NOTE — Anesthesia Procedure Notes (Signed)
Procedure Name: Intubation Date/Time: 04/07/2016 11:53 PM Performed by: Lind Covert Pre-anesthesia Checklist: Patient identified, Emergency Drugs available, Suction available, Patient being monitored and Timeout performed Patient Re-evaluated:Patient Re-evaluated prior to inductionOxygen Delivery Method: Circle system utilized Preoxygenation: Pre-oxygenation with 100% oxygen Intubation Type: IV induction Laryngoscope Size: Mac and 4 Grade View: Grade I Tube type: Oral Tube size: 7.0 mm Number of attempts: 1 Airway Equipment and Method: Stylet Placement Confirmation: ETT inserted through vocal cords under direct vision,  CO2 detector and breath sounds checked- equal and bilateral Secured at: 22 cm Tube secured with: Tape Dental Injury: Teeth and Oropharynx as per pre-operative assessment

## 2016-04-08 NOTE — Op Note (Signed)
Appendectomy, Lap, Procedure Note  Pre-operative Diagnosis: Acute appendicitis  Post-operative Diagnosis: Same  Procedure:  Laparoscopic appendectomy  Surgeon:  Jackolyn Confer, M.D.  Anesthesia:  General   Indications:  This is a 55 year old female who presented to the emergency department with worsening abdominal pain. Her evaluation demonstrated acute appendicitis and she is brought to the operating room for appendectomy.  Procedure Details   She was brought to the operating room, placed in the supine position and general anesthesia was induced, along with placement of orogastric tube, SCDs, and a Foley catheter. A timeout was performed. The abdomen was prepped and draped in a sterile fashion. A small infraumbilical incision was made through the skin, subcutaneous tissue, fascia, and peritoneum entering the peritoneal cavity under direct vision.  A pursestring suture was passed around the fascia with a 0 Vicryl.  The Hasson was introduced into the peritoneal cavity and the tails of the suture were used to hold the Hasson in place.   The pneumoperitoneum was then established to steady pressure of 15 mmHg.   The laparoscope was introduced and there was no evidence of bleeding or underlying organ injury. Additional 5 mm cannulas then placed in the left lower quadrant of the abdomen and the right upper quadrant region under direct visualization. A careful evaluation of the entire abdomen was carried out. The patient was placed in Trendelenburg and left lateral decubitus position. The small intestines were retracted in the cephalad and left lateral direction away from the pelvis and right lower quadrant. The patient was found to have an enlarged and inflamed appendix that was extending toward the lower midline. There was no evidence of perforation.  The appendix was carefully mobilized. The mesoappendix was was divided with the harmonic scalpel.   The appendix was amputated off the cecum, with a  small cuff of cecum, using an endo-GIA stapler.  The appendix was placed in a retrieval bag and removed through the subumbilical port incision.    There was no evidence of leak from the staple line. There was some bleeding from the staple line was controlled with clips. Copious irrigation was  performed and irrigant fluid suctioned from the abdomen as much as possible.  Surgicel was placed on the mesoappendix and staple line.   The umbilical trocar was removed and the  port site fascia was closed via the purse string suture under laparoscopic vision. There was no residual palpable fascial defect.  The remaining trocars were removed and all  trocar site skin wounds were closed with 4-0 Monocryl.  Instrument, sponge, and needle counts were correct at the conclusion of the case.   Findings: The appendix was found to be inflamed. There were not signs of necrosis.  There was not perforation. There was not abscess formation.  Estimated Blood Loss:  150 ml         Drains: none         Specimens: appendix         Complications:  None; patient tolerated the procedure well.         Disposition: PACU - hemodynamically stable.         Condition: stable

## 2016-04-08 NOTE — Discharge Summary (Signed)
[facility] Discharge Summary   Patient ID: Crystal Mcguire MRN: [id number] DOB/AGE: October 31, 1960 55 y.o.  Admit date: 04/07/2016 Discharge date: 04/08/2016  Admitting Diagnosis: Acute appendicitis   Discharge Diagnosis Patient Active Problem List   Diagnosis Date Noted  . Acute appendicitis 04/08/2016  . MOLE 04/01/2008    Consultants None  Imaging: Ct Abdomen Pelvis W Contrast  Result Date: 04/07/2016 CLINICAL DATA:  55 year old female with abdominal pain since noon. Nausea and vomiting. Initial encounter. EXAM: CT ABDOMEN AND PELVIS WITH CONTRAST TECHNIQUE: Multidetector CT imaging of the abdomen and pelvis was performed using the standard protocol following bolus administration of intravenous contrast. CONTRAST:  154mL ISOVUE-300 IOPAMIDOL (ISOVUE-300) INJECTION 61% COMPARISON:  None. FINDINGS: Lower chest: No pericardial or pleural effusion. Mild right lung base atelectasis. No upper abdominal free air. Hepatobiliary: Negative aside from borderline to mild periportal edema. Pancreas: Negative. Spleen: Negative; several subtle sub cm low-density areas occasionally noted including series 2, image 20, which appear benign and inconsequential. Adrenals/Urinary Tract: Negative adrenal glands. Bilateral renal enhancement and contrast excretion is normal. Unremarkable urinary bladder. Stomach/Bowel: Negative rectum. Distal sigmoid colon is decompressed. Redundant proximal sigmoid mildly gas distended. Decompressed descending colon. Redundant gas-filled splenic flexure and transverse colon. Gas and stool in the right colon. The cecum is located in the pelvis near midline. Negative terminal ileum. It appears the appendix arises along the posterior wall of the cecum projecting toward the sacrum. There might be small isodense fecaliths at the base of the appendix (coronal image 56) while the more distal appendix appears dilated up to 12 mm and contains a combination of gas and  fluid. The appendix is inseparable here from the distal sigmoid colon. There might be trace free fluid along the course of the appendix. No definite extraluminal gas. No pneumoperitoneum. The distal small bowel is decompressed. There is trace oral contrast mixed with gas in the proximal small bowel, some of which is mildly prominent. Small volume fluid in the stomach. Decompressed duodenum. Vascular/Lymphatic: Mild Calcified aortic atherosclerosis. Major arterial structures are patent. Portal venous system is patent. No lymphadenopathy. Reproductive: Multiple rounded uterine masses ranging from 2.5-4.1 cm. The largest located in the lower uterine segment has speckled calcification, and most likely these are multiple intramural fibroids. Negative adnexa. Other: No pelvic free fluid. Musculoskeletal: Lumbosacral junction disc and endplate degeneration. No acute osseous abnormality identified. IMPRESSION: 1. Suspicion of early acute appendicitis with abnormal appearance of the midportion of the appendix which appears dilated with possible trace free fluid. Note that the appendix arises along the posterior wall of the cecum which is located near midline in the pelvis. The midportion of the appendix is inseparable from the distal sigmoid colon. If the clinical course is equivocal then administration of oral contrast and repeat CT abdomen and pelvis (IV contrast preferred) in 12 hours would be recommended. 2. No other acute or inflammatory process identified in the abdomen or pelvis. 3. Fibroid uterus. Electronically Signed   By: Genevie Ann M.D.   On: 04/07/2016 21:23    Procedures Dr. Jackolyn Confer (04/07/16) - Laparoscopic Appendectomy  Hospital Course:  55 y/o female who presented to [facility] with epigastric and RUQ pain associated with nausea/vomiting.  Workup showed leukocytosis and CT scan as above.  Patient was admitted and underwent procedure listed above.  Tolerated procedure well and was transferred to the  floor.  Diet was advanced as tolerated.  On POD#0, the patient was voiding well, tolerating diet, ambulating well, pain well controlled, vital signs stable,  incisions c/d/i and felt stable for discharge home. Patient will follow up in our office in 2 weeks and knows to call with questions or concerns. She will call to confirm appointment date/time.    Physical Exam: General:  Alert, NAD, pleasant, comfortable Abd: Soft, ND, appropriately tender, incisions C/D/I     Medication List    TAKE these medications   Estradiol 10 MCG Tabs vaginal tablet Place one tablet (10 mcg) in vagina at hs for 2 weeks. Then place one tablet in vagina at hs twice a week. What changed:  additional instructions   HYDROcodone-acetaminophen 5-325 MG tablet Commonly known as:  NORCO/VICODIN Take 1-2 tablets by mouth every 4 (four) hours as needed for moderate pain.   OMEGA 3 PO Take 1 tablet by mouth daily. With CoQ10   VITAMIN B 12 PO Take 1 tablet by mouth daily.   Vitamin D (Ergocalciferol) 50000 units Caps capsule Commonly known as:  DRISDOL Take 5,000 Units by mouth every 7 (seven) days.       Follow-up [facility], [place]. Go on 04/23/2016.   Specialty:  General Surgery Why:  at 1:30 PM for post-operative follow up. please arrive 30 minutes early to get checked in and fill out any necessary paperwork. Contact information: [address] [place] [phone]          Signed: Obie Dredge, [facility] 04/08/2016, 12:48 PM Pager: [phone] Consults: [phone] Mon-Fri 7:00 am-4:30 pm Sat-Sun 7:00 am-11:30 am

## 2016-04-08 NOTE — Transfer of Care (Signed)
Immediate Anesthesia Transfer of Care Note  Patient: Crystal Mcguire  Procedure(s) Performed: Procedure(s): APPENDECTOMY LAPAROSCOPIC (N/A)  Patient Location: PACU  Anesthesia Type:General  Level of Consciousness: sedated  Airway & Oxygen Therapy: Patient Spontanous Breathing and Patient connected to face mask oxygen  Post-op Assessment: Report given to RN and Post -op Vital signs reviewed and stable  Post vital signs: Reviewed and stable  Last Vitals:  Vitals:   04/07/16 2230 04/07/16 2300  BP: 107/62 105/64  Pulse: 100 111  Resp:  18  Temp:      Last Pain:  Vitals:   04/07/16 2255  TempSrc:   PainSc: 4          Complications: No apparent anesthesia complications

## 2016-04-08 NOTE — Anesthesia Postprocedure Evaluation (Signed)
Anesthesia Post Note  Patient: Crystal Mcguire  Procedure(s) Performed: Procedure(s) (LRB): APPENDECTOMY LAPAROSCOPIC (N/A)  Patient location during evaluation: PACU Anesthesia Type: General Level of consciousness: sedated and patient cooperative Pain management: pain level controlled Vital Signs Assessment: post-procedure vital signs reviewed and stable Respiratory status: spontaneous breathing Cardiovascular status: stable Anesthetic complications: no    Last Vitals:  Vitals:   04/08/16 0702 04/08/16 1424  BP: (!) 98/56 95/63  Pulse: 76 79  Resp: 18 18  Temp: 36.9 C 36.8 C    Last Pain:  Vitals:   04/08/16 1424  TempSrc: Oral  PainSc:                  [name]

## 2016-04-08 NOTE — Discharge Instructions (Signed)
[facility] ______[facility] LAPAROSCOPIC APPENDECTOMY SURGERY: POST OP INSTRUCTIONS Always review your discharge instruction sheet given to you by the facility where your surgery was performed. IF YOU HAVE DISABILITY OR FAMILY LEAVE FORMS, YOU MUST BRING THEM TO THE OFFICE FOR PROCESSING.   DO NOT GIVE THEM TO YOUR DOCTOR.  1. A prescription for pain medication may be given to you upon discharge.  Take your pain medication as prescribed, if needed.  If narcotic pain medicine is not needed, then you may take acetaminophen (Tylenol) or ibuprofen (Advil) as needed. 2. Take your usually prescribed medications unless otherwise directed. 3. If you need a refill on your pain medication, please contact your pharmacy.  They will contact our office to request authorization. Prescriptions will not be filled after 5pm or on week-ends. 4. You should follow a light diet the first few days after arrival home, such as soup and crackers, etc.  Be sure to include lots of fluids daily.  May start lowfat, solid foods 2 days after the surgery. 5. Most patients will experience some swelling and bruising in the area of the incisions.  Ice packs will help.  Swelling and bruising can take several days to resolve.  6. It is common to experience some constipation if taking pain medication after surgery.  Increasing fluid intake and taking a stool softener (such as Colace) will usually help or prevent this problem from occurring.  A mild laxative (Milk of Magnesia or Miralax) should be taken according to package instructions if there are no bowel movements after 48 hours. 7. Unless discharge instructions indicate otherwise, you may remove your bandages 72 hours after surgery.  You may shower the day after surgery.  You may have steri-strips (small skin tapes) in place directly over the incision.  These strips should be left on the skin until they fall off.  If your surgeon used skin glue on the incision, you may shower  in 24 hours.  The glue will flake off over the next 2-3 weeks.  Any sutures or staples will be removed at the office during your follow-up visit. 8. ACTIVITIES:  You may resume regular (light) daily activities beginning the next day--such as daily self-care, walking, climbing stairs--gradually increasing activities as tolerated.  You may have sexual intercourse when it is comfortable.  Refrain from any heavy lifting or straining for two weeks.  Do not lift anything over 10 pounds during that time.  a. You may drive when you are no longer taking prescription pain medication, you can comfortably wear a seatbelt, and you can safely maneuver your car and apply brakes. b. RETURN TO WORK:  Desk type work in 1 week, full duty work in 2 weeks if you are pain-free.________________________________________________________ 9. You should see your doctor in the office for a follow-up appointment approximately 2-3 weeks after your surgery.  Make sure that you call for this appointment within a day or two after you arrive home to insure a convenient appointment time. 10. OTHER INSTRUCTIONS: __________________________________________________________________________________________________________________________ __________________________________________________________________________________________________________________________ WHEN TO CALL YOUR DOCTOR: 1. Fever over 101.0 2. Inability to urinate 3. Continued bleeding from incision. 4. Increased pain, redness, or drainage from the incision. 5. Increasing abdominal pain  The clinic staff is available to answer your questions during regular business hours.  Please dont hesitate to call and ask to speak to one of the nurses for clinical concerns.  If you have a medical emergency, go to the nearest emergency room or call 911.  A surgeon from  [facility] is always on call at the hospital. [address], [place], [place], [place]  [address] ? [address], [place], [place]   [address] [phone] ? [phone] ? FAX [phone] Web site: [email or web address]

## 2016-04-08 NOTE — Progress Notes (Signed)
Went over all discharge paperwork with patient and family.  All questions answered.  Discharge papers and prescriptions given to patient.  Pt discharged via wheelchair.

## 2016-04-08 NOTE — Care Management Note (Signed)
Case Management Note  Patient Details  Name: Crystal Mcguire MRN: [id number] Date of Birth: May 30, 1960  Subjective/Objective:  55 y/o f admitted w/Acute appendicitis. From home.                  Action/Plan:d/c home.   Expected Discharge Date:                 Expected Discharge Plan:  Home/Self Care  In-House Referral:     Discharge planning Services  CM Consult  Post Acute Care Choice:    Choice offered to:     DME Arranged:    DME Agency:     HH Arranged:    [place] Agency:     Status of Service:  Completed, signed off  If discussed at [employer] of Stay Meetings, dates discussed:    Additional Comments:  Dessa Phi, RN 04/08/2016, 12:57 PM

## 2016-12-26 ENCOUNTER — Ambulatory Visit: Payer: Federal, State, Local not specified - PPO | Admitting: Obstetrics and Gynecology

## 2017-01-06 ENCOUNTER — Encounter: Payer: Self-pay | Admitting: Obstetrics and Gynecology

## 2017-01-06 ENCOUNTER — Ambulatory Visit (INDEPENDENT_AMBULATORY_CARE_PROVIDER_SITE_OTHER): Payer: Federal, State, Local not specified - PPO | Admitting: Obstetrics and Gynecology

## 2017-01-06 ENCOUNTER — Other Ambulatory Visit (HOSPITAL_COMMUNITY)
Admission: RE | Admit: 2017-01-06 | Discharge: 2017-01-06 | Disposition: A | Discharge status: Home / Self Care | Payer: Federal, State, Local not specified - PPO | Source: Ambulatory Visit | Attending: Obstetrics and Gynecology | Admitting: Obstetrics and Gynecology

## 2017-01-06 VITALS — BP 92/60 | HR 76 | Resp 14 | Ht 61.0 in | Wt 131.0 lb

## 2017-01-06 DIAGNOSIS — Z01419 Encounter for gynecological examination (general) (routine) without abnormal findings: Secondary | ICD-10-CM

## 2017-01-06 DIAGNOSIS — Z124 Encounter for screening for malignant neoplasm of cervix: Secondary | ICD-10-CM

## 2017-01-06 NOTE — Progress Notes (Signed)
56 y.o. G0P0000 SingleCaucasianF here for annual exam.  No vaginal bleeding. No vasomotor symptoms.     Patient's last menstrual period was 07/03/2012.          Sexually active: No.  H/O female partners The current method of family planning is post menopausal status.    Exercising: Yes.    walking/ bowflex Smoker:  no  Health Maintenance: Pap:  06-28-13 WNL NEG HR HPV  History of abnormal Pap:  no MMG:  2017 WNL per patient (does yearly through her primary MD) Colonoscopy:  07-18-09  BMD:   Never TDaP:  Up to date- PCP does  Gardasil: N/A   reports that she has never smoked. She has never used smokeless tobacco. She reports that she drinks about 2.4 oz of alcohol per week . She reports that she does not use drugs. .Product/process development scientist. Sister in [place], brother with parkinson's also in [place].  No past medical history on file.  Past Surgical History:  Procedure Laterality Date  . LAPAROSCOPIC APPENDECTOMY N/A 04/07/2016   Procedure: APPENDECTOMY LAPAROSCOPIC;  Surgeon: Jackolyn Confer, MD;  Location: [facility];  Service: General;  Laterality: N/A;    Current Outpatient Prescriptions  Medication Sig Dispense Refill  . Cyanocobalamin (VITAMIN B 12 PO) Take 1 tablet by mouth daily.      No current facility-administered medications for this visit.     Family History  Problem Relation Age of Onset  . Hypertension Mother   . Breast cancer Mother 35       radiation  . Hypertension Father   . Thyroid disease Brother        Grave's disease  . Parkinson's disease Brother   . Heart disease Brother     Review of Systems  Constitutional: Negative.   HENT: Negative.   Eyes: Negative.   Respiratory: Negative.   Cardiovascular: Negative.   Gastrointestinal: Negative.   Endocrine: Negative.   Genitourinary: Negative.   Musculoskeletal: Negative.   Skin: Negative.   Allergic/Immunologic: Negative.   Neurological: Negative.   Psychiatric/Behavioral: Negative.     Exam:   BP 92/60 (BP Location:  Right Arm, Patient Position: Sitting, Cuff Size: Normal)   Pulse 76   Resp 14   Ht 5\' 1"  (1.549 m)   Wt 131 lb (59.4 kg)   LMP 07/03/2012   BMI 24.75 kg/m   Weight change: @WEIGHTCHANGE @ Height:   Height: 5\' 1"  (154.9 cm)  Ht Readings from Last 3 Encounters:  01/06/17 5\' 1"  (1.549 m)  04/08/16 5\' 1"  (1.549 m)  12/20/15 5\' 1"  (1.549 m)    General appearance: alert, cooperative and appears stated age Head: Normocephalic, without obvious abnormality, atraumatic Neck: no adenopathy, supple, symmetrical, trachea midline and thyroid normal to inspection and palpation Lungs: clear to auscultation bilaterally Cardiovascular: regular rate and rhythm Breasts: normal appearance, no masses or tenderness Abdomen: soft, non-tender; bowel sounds normal; no masses,  no organomegaly Extremities: extremities normal, atraumatic, no cyanosis or edema Skin: Skin color, texture, turgor normal. No rashes or lesions Lymph nodes: Cervical, supraclavicular, and axillary nodes normal. No abnormal inguinal nodes palpated Neurologic: Grossly normal   Pelvic: External genitalia:  no lesions              Urethra:  normal appearing urethra with no masses, tenderness or lesions              Bartholins and Skenes: normal                 Vagina:  atrophic vagina, no lesions, no discharge              Cervix: not well seen, patient uncomfortable with pediatric speculum, cervix deep in the vagina.                Bimanual Exam:  Uterus:  normal size, contour, position, consistency, mobility, non-tender              Adnexa: no mass, fullness, tenderness               Rectovaginal: Confirms               Anus:  normal sphincter tone, no lesions  Chaperone was present for exam.  A:  Well Woman with normal exam  P:   Pap with hpv  Mammogram and colonoscopy UTD  Labs and immunizations with primary MD  Information given on breast self awareness, calcium and vit d

## 2017-01-06 NOTE — Patient Instructions (Addendum)
EXERCISE AND DIET:  We recommended that you start or continue a regular exercise program for good health. Regular exercise means any activity that makes your heart beat faster and makes you sweat.  We recommend exercising at least 30 minutes per day at least 3 days a week, preferably 4 or 5.  We also recommend a diet low in fat and sugar.  Inactivity, poor dietary choices and obesity can cause diabetes, heart attack, stroke, and kidney damage, among others.    ALCOHOL AND SMOKING:  Women should limit their alcohol intake to no more than 7 drinks/beers/glasses of wine (combined, not each!) per week. Moderation of alcohol intake to this level decreases your risk of breast cancer and liver damage. And of course, no recreational drugs are part of a healthy lifestyle.  And absolutely no smoking or even second hand smoke. Most people know smoking can cause heart and lung diseases, but did you know it also contributes to weakening of your bones? Aging of your skin?  Yellowing of your teeth and nails?  CALCIUM AND VITAMIN D:  Adequate intake of calcium and Vitamin D are recommended.  The recommendations for exact amounts of these supplements seem to change often, but generally speaking 600 mg of calcium (either carbonate or citrate) and 800 units of Vitamin D per day seems prudent. Certain women may benefit from higher intake of Vitamin D.  If you are among these women, your doctor will have told you during your visit.    PAP SMEARS:  Pap smears, to check for cervical cancer or precancers,  have traditionally been done yearly, although recent scientific advances have shown that most women can have pap smears less often.  However, every woman still should have a physical exam from her gynecologist every year. It will include a breast check, inspection of the vulva and vagina to check for abnormal growths or skin changes, a visual exam of the cervix, and then an exam to evaluate the size and shape of the uterus and  ovaries.  And after 58 years of age, a rectal exam is indicated to check for rectal cancers. We will also provide age appropriate advice regarding health maintenance, like when you should have certain vaccines, screening for sexually transmitted diseases, bone density testing, colonoscopy, mammograms, etc.   MAMMOGRAMS:  All women over 40 years old should have a yearly mammogram. Many facilities now offer a "3D" mammogram, which may cost around $50 extra out of pocket. If possible,  we recommend you accept the option to have the 3D mammogram performed.  It both reduces the number of women who will be called back for extra views which then turn out to be normal, and it is better than the routine mammogram at detecting truly abnormal areas.    COLONOSCOPY:  Colonoscopy to screen for colon cancer is recommended for all women at age 50.  We know, you hate the idea of the prep.  We agree, BUT, having colon cancer and not knowing it is worse!!  Colon cancer so often starts as a polyp that can be seen and removed at colonscopy, which can quite literally save your life!  And if your first colonoscopy is normal and you have no family history of colon cancer, most women don't have to have it again for 10 years.  Once every ten years, you can do something that may end up saving your life, right?  We will be happy to help you get it scheduled when you are ready.    Be sure to check your insurance coverage so you understand how much it will cost.  It may be covered as a preventative service at no cost, but you should check your particular policy.      Breast Self-Awareness Breast self-awareness means being familiar with how your breasts look and feel. It involves checking your breasts regularly and reporting any changes to your health care provider. Practicing breast self-awareness is important. A change in your breasts can be a sign of a serious medical problem. Being familiar with how your breasts look and feel allows  you to find any problems early, when treatment is more likely to be successful. All women should practice breast self-awareness, including women who have had breast implants. How to do a breast self-exam One way to learn what is normal for your breasts and whether your breasts are changing is to do a breast self-exam. To do a breast self-exam: Look for Changes  1. Remove all the clothing above your waist. 2. Stand in front of a mirror in a room with good lighting. 3. Put your hands on your hips. 4. Push your hands firmly downward. 5. Compare your breasts in the mirror. Look for differences between them (asymmetry), such as: ? Differences in shape. ? Differences in size. ? Puckers, dips, and bumps in one breast and not the other. 6. Look at each breast for changes in your skin, such as: ? Redness. ? Scaly areas. 7. Look for changes in your nipples, such as: ? Discharge. ? Bleeding. ? Dimpling. ? Redness. ? A change in position. Feel for Changes  Carefully feel your breasts for lumps and changes. It is best to do this while lying on your back on the floor and again while sitting or standing in the shower or tub with soapy water on your skin. Feel each breast in the following way:  Place the arm on the side of the breast you are examining above your head.  Feel your breast with the other hand.  Start in the nipple area and make  inch (2 cm) overlapping circles to feel your breast. Use the pads of your three middle fingers to do this. Apply light pressure, then medium pressure, then firm pressure. The light pressure will allow you to feel the tissue closest to the skin. The medium pressure will allow you to feel the tissue that is a little deeper. The firm pressure will allow you to feel the tissue close to the ribs.  Continue the overlapping circles, moving downward over the breast until you feel your ribs below your breast.  Move one finger-width toward the center of the body.  Continue to use the  inch (2 cm) overlapping circles to feel your breast as you move slowly up toward your collarbone.  Continue the up and down exam using all three pressures until you reach your armpit.  Write Down What You Find  Write down what is normal for each breast and any changes that you find. Keep a written record with breast changes or normal findings for each breast. By writing this information down, you do not need to depend only on memory for size, tenderness, or location. Write down where you are in your menstrual cycle, if you are still menstruating. If you are having trouble noticing differences in your breasts, do not get discouraged. With time you will become more familiar with the variations in your breasts and more comfortable with the exam. How often should I examine my breasts? Examine   your breasts every month. If you are breastfeeding, the best time to examine your breasts is after a feeding or after using a breast pump. If you menstruate, the best time to examine your breasts is 5-7 days after your period is over. During your period, your breasts are lumpier, and it may be more difficult to notice changes. When should I see my health care provider? See your health care provider if you notice:  A change in shape or size of your breasts or nipples.  A change in the skin of your breast or nipples, such as a reddened or scaly area.  Unusual discharge from your nipples.  A lump or thick area that was not there before.  Pain in your breasts.  Anything that concerns you.  This information is not intended to replace advice given to you by your health care provider. Make sure you discuss any questions you have with your health care provider. Document Released: 05/06/2005 Document Revised: 10/12/2015 Document Reviewed: 03/26/2015 Elsevier Interactive Patient Education  2018 [employer]vier Inc[employer]  

## 2017-01-08 LAB — CYTOLOGY - PAP
Diagnosis: NEGATIVE
HPV (WINDOPATH): NOT DETECTED

## 2018-01-02 NOTE — Progress Notes (Deleted)
57 y.o. G0P0000 SingleCaucasianF here for annual exam.      Patient's last menstrual period was 07/03/2012.          Sexually active: {yes no:314532}  The current method of family planning is {contraception:315051}.    Exercising: {yes no:314532}  {types:19826} Smoker:  {YES [id number]  Health Maintenance: Pap:  01/06/2018 WNL History of abnormal Pap:  no MMG:  *** Colonoscopy:  07/18/2009 BMD:    TDaP:  *** Gardasil: never   reports that she has never smoked. She has never used smokeless tobacco. She reports that she drinks about 4.0 standard drinks of alcohol per week. She reports that she does not use drugs.  No past medical history on file.  Past Surgical History:  Procedure Laterality Date  . APPENDECTOMY    . LAPAROSCOPIC APPENDECTOMY N/A 04/07/2016   Procedure: APPENDECTOMY LAPAROSCOPIC;  Surgeon: Jackolyn Confer, MD;  Location: [facility];  Service: General;  Laterality: N/A;    Current Outpatient Medications  Medication Sig Dispense Refill  . Cyanocobalamin (VITAMIN B 12 PO) Take 1 tablet by mouth daily.      No current facility-administered medications for this visit.     Family History  Problem Relation Age of Onset  . Hypertension Mother   . Breast cancer Mother 43       radiation  . Hypertension Father   . Thyroid disease Brother        Grave's disease  . Parkinson's disease Brother   . Heart disease Brother     Review of Systems  Constitutional: Negative.   HENT: Negative.   Eyes: Negative.   Respiratory: Negative.   Cardiovascular: Negative.   Gastrointestinal: Negative.   Endocrine: Negative.   Genitourinary: Negative.   Musculoskeletal: Negative.   Skin: Negative.   Allergic/Immunologic: Negative.   Neurological: Negative.   Hematological: Negative.   Psychiatric/Behavioral: Negative.   All other systems reviewed and are negative.   Exam:   LMP 07/03/2012   Weight change: @WEIGHTCHANGE @ Height:      Ht Readings from Last 3 Encounters:   01/06/17 5\' 1"  (1.549 m)  04/08/16 5\' 1"  (1.549 m)  12/20/15 5\' 1"  (1.549 m)    General appearance: alert, cooperative and appears stated age Head: Normocephalic, without obvious abnormality, atraumatic Neck: no adenopathy, supple, symmetrical, trachea midline and thyroid {CHL AMB PHY EX THYROID NORM DEFAULT:[phone]::"normal to inspection and palpation"} Lungs: clear to auscultation bilaterally Cardiovascular: regular rate and rhythm Breasts: {Exam; breast:13139::"normal appearance, no masses or tenderness"} Abdomen: soft, non-tender; non distended,  no masses,  no organomegaly Extremities: extremities normal, atraumatic, no cyanosis or edema Skin: Skin color, texture, turgor normal. No rashes or lesions Lymph nodes: Cervical, supraclavicular, and axillary nodes normal. No abnormal inguinal nodes palpated Neurologic: Grossly normal   Pelvic: External genitalia:  no lesions              Urethra:  normal appearing urethra with no masses, tenderness or lesions              Bartholins and Skenes: normal                 Vagina: normal appearing vagina with normal color and discharge, no lesions              Cervix: {CHL AMB PHY EX CERVIX NORM [phone]::"no lesions"}               Bimanual Exam:  Uterus:  {CHL AMB PHY EX UTERUS NORM [phone]::"normal size, contour, position, consistency,  mobility, non-tender"}              Adnexa: {CHL AMB PHY EX ADNEXA NO MASS [phone]::"no mass, fullness, tenderness"}               Rectovaginal: Confirms               Anus:  normal sphincter tone, no lesions  Chaperone was present for exam.  A:  Well Woman with normal exam  P:

## 2018-01-12 ENCOUNTER — Ambulatory Visit: Payer: Federal, State, Local not specified - PPO | Admitting: Obstetrics and Gynecology

## 2018-01-12 ENCOUNTER — Telehealth: Payer: Self-pay | Admitting: Obstetrics and Gynecology

## 2018-01-12 NOTE — Telephone Encounter (Signed)
Patient called and cancelled her AEX for today with Dr. Talbert Nan due to a death in the family. She rescheduled to 02/03/18 at 9:00 AM. Routing to provider for FYI only.

## 2018-02-02 NOTE — Progress Notes (Signed)
57 y.o. G0P0000 Single White or Caucasian Not Hispanic or Latino female here for annual exam.  No vaginal bleeding. Not sexually active.  Period Cycle (Days): (post menopausal)  She is planning to go to [place] and [place] to [place] her 58th birthday. Thinking about retirement.   Patient's last menstrual period was 07/03/2012.          Sexually active: No. H/O female partners.  The current method of family planning is status post hysterectomy.    Exercising: Yes.    walk, hike, bowflex Smoker:  no  Health Maintenance: Pap:  2018 normal History of abnormal Pap:  no MMG:  2018-2019 WNL per patient (does yearly through her primary MD at [facility]) BMD:   never Colonoscopy: 2011 due 2021 TDAP up to day per patient Gardasil: never   reports that she has never smoked. She has never used smokeless tobacco. She reports that she drinks about 4.0 standard drinks of alcohol per week. 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 Past Medical History:  Diagnosis Date  . Vitamin D deficiency     Past Surgical History:  Procedure Laterality Date  . APPENDECTOMY    . LAPAROSCOPIC APPENDECTOMY N/A 04/07/2016   Procedure: APPENDECTOMY LAPAROSCOPIC;  Surgeon: Jackolyn Confer, MD;  Location: [facility];  Service: General;  Laterality: N/A;    Current Outpatient Medications  Medication Sig Dispense Refill  . Cyanocobalamin (VITAMIN B 12 PO) Take 1 tablet by mouth daily.     . Vitamin D, Ergocalciferol, (DRISDOL) 50000 units CAPS capsule TK 1 C PO 1 TIME A WK  1   No current facility-administered medications for this visit.     Family History  Problem Relation Age of Onset  . Hypertension Mother   . Breast cancer Mother 30       radiation  . Hypertension Father   . Thyroid disease Brother        Grave's disease  . Parkinson's disease Brother   . Heart disease Brother     Review of Systems  Constitutional: Negative.   HENT:  Negative.   Eyes: Negative.   Respiratory: Negative.   Cardiovascular: Negative.   Gastrointestinal: Negative.   Endocrine: Negative.   Genitourinary: Negative.   Musculoskeletal: Negative.   Skin: Negative.   Allergic/Immunologic: Negative.   Neurological: Negative.   Hematological: Negative.   Psychiatric/Behavioral: Negative.   All other systems reviewed and are negative.   Exam:   BP 102/68   Pulse 79   Ht 5\' 1"  (1.549 m)   Wt 138 lb (62.6 kg)   LMP 07/03/2012   BMI 26.07 kg/m   Weight change: @WEIGHTCHANGE @ Height:   Height: 5\' 1"  (154.9 cm)  Ht Readings from Last 3 Encounters:  02/03/18 5\' 1"  (1.549 m)  01/06/17 5\' 1"  (1.549 m)  04/08/16 5\' 1"  (1.549 m)    General appearance: alert, cooperative and appears stated age Head: Normocephalic, without obvious abnormality, atraumatic Neck: no adenopathy, supple, symmetrical, trachea midline and thyroid normal to inspection and palpation Lungs: clear to auscultation bilaterally Cardiovascular: regular rate and rhythm Breasts: normal appearance, no masses or tenderness Abdomen: soft, non-tender; non distended,  no masses,  no organomegaly Extremities: extremities normal, atraumatic, no cyanosis or edema Skin: Skin color, texture, turgor normal. No rashes or lesions Lymph nodes: Cervical, supraclavicular, and axillary nodes normal. No abnormal inguinal nodes palpated Neurologic: Grossly normal   Pelvic: External genitalia:  no lesions  Urethra:  normal appearing urethra with no masses, tenderness or lesions              Bartholins and Skenes: normal                 Vagina: normal appearing vagina with normal color and discharge, no lesions              Cervix: no lesions               Bimanual Exam:  Uterus:  normal size, contour, position, consistency, mobility, non-tender              Adnexa: no mass, fullness, tenderness               Rectovaginal: Confirms               Anus:  normal sphincter tone, no  lesions  Chaperone was present for exam.  A:  Well Woman with normal exam  P:   No pap this year  Labs with primary  Getting mammograms yearly through primary (given # of breast center), will request last her mammogram from [facility]  Colonoscopy UTD  On vit D supplements  Discussed breast self exam

## 2018-02-03 ENCOUNTER — Ambulatory Visit (INDEPENDENT_AMBULATORY_CARE_PROVIDER_SITE_OTHER): Payer: Federal, State, Local not specified - PPO | Admitting: Obstetrics and Gynecology

## 2018-02-03 ENCOUNTER — Encounter: Payer: Self-pay | Admitting: Obstetrics and Gynecology

## 2018-02-03 ENCOUNTER — Other Ambulatory Visit: Payer: Self-pay

## 2018-02-03 VITALS — BP 102/68 | HR 79 | Ht 61.0 in | Wt 138.0 lb

## 2018-02-03 DIAGNOSIS — Z01419 Encounter for gynecological examination (general) (routine) without abnormal findings: Secondary | ICD-10-CM

## 2018-02-03 NOTE — Patient Instructions (Signed)

## 2018-02-16 ENCOUNTER — Encounter: Payer: Self-pay | Admitting: Obstetrics and Gynecology

## 2018-11-30 DIAGNOSIS — J3489 Other specified disorders of nose and nasal sinuses: Secondary | ICD-10-CM | POA: Diagnosis not present

## 2018-12-21 DIAGNOSIS — Z0000 Encounter for general adult medical examination without abnormal findings: Secondary | ICD-10-CM | POA: Diagnosis not present

## 2018-12-21 DIAGNOSIS — Z8249 Family history of ischemic heart disease and other diseases of the circulatory system: Secondary | ICD-10-CM | POA: Diagnosis not present

## 2018-12-21 DIAGNOSIS — G479 Sleep disorder, unspecified: Secondary | ICD-10-CM | POA: Diagnosis not present

## 2018-12-21 DIAGNOSIS — Z13220 Encounter for screening for lipoid disorders: Secondary | ICD-10-CM | POA: Diagnosis not present

## 2018-12-21 DIAGNOSIS — K0889 Other specified disorders of teeth and supporting structures: Secondary | ICD-10-CM | POA: Diagnosis not present

## 2019-01-11 DIAGNOSIS — G4733 Obstructive sleep apnea (adult) (pediatric): Secondary | ICD-10-CM | POA: Diagnosis not present

## 2019-01-12 DIAGNOSIS — G4733 Obstructive sleep apnea (adult) (pediatric): Secondary | ICD-10-CM | POA: Diagnosis not present

## 2019-02-08 ENCOUNTER — Other Ambulatory Visit: Payer: Self-pay

## 2019-02-09 NOTE — Progress Notes (Signed)
58 y.o. G0P0000 Single White or Caucasian Not Hispanic or Latino female here for annual exam.   No vaginal bleeding. No bowel or bladder changes. Not sexually active in the last year.     Patient's last menstrual period was 07/03/2012.          Sexually active: No. H/O female partners The current method of family planning is post menopausal status.    Exercising: Yes.    walking Smoker:  no  Health Maintenance: Pap:  2018 WNL NEG HPV History of abnormal Pap:  no MMG:  04/25/2017 Birads 1 negative, she did one last December at [facility], told normal. BMD:   never Colonoscopy: 2011 due 2021 TDAP: UTD per patient Gardasil: never   reports that she has never smoked. She has never used smokeless tobacco. She reports current alcohol use of about 4.0 standard drinks of alcohol per week. 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 She adopted an adult pit bull mix last spring, great dog! Not sure when she will retire yet.   Past Medical History:  Diagnosis Date  . Vitamin D deficiency     Past Surgical History:  Procedure Laterality Date  . APPENDECTOMY    . LAPAROSCOPIC APPENDECTOMY N/A 04/07/2016   Procedure: APPENDECTOMY LAPAROSCOPIC;  Surgeon: Jackolyn Confer, MD;  [facility];  Service: General;  Laterality: N/A;    Current Outpatient Medications  Medication Sig Dispense Refill  . Cyanocobalamin (VITAMIN B 12 PO) Take 1 tablet by mouth daily.     . Vitamin D, Ergocalciferol, (DRISDOL) 50000 units CAPS capsule TK 1 C PO 1 TIME A WK  1   No current facility-administered medications for this visit.     Family History  Problem Relation Age of Onset  . Hypertension Mother   . Breast cancer Mother 52       radiation  . Hypertension Father   . Thyroid disease Brother        Grave's disease  . Heart disease Brother   . Parkinson's disease Brother     Review of Systems  Constitutional: Negative.   HENT:  Negative.   Eyes: Negative.   Respiratory: Negative.   Cardiovascular: Negative.   Gastrointestinal: Negative.   Endocrine: Negative.   Genitourinary: Negative.   Musculoskeletal: Negative.   Allergic/Immunologic: Negative.   Neurological: Negative.   Hematological: Negative.   Psychiatric/Behavioral: Negative.     Exam:   BP 112/80 (BP Location: Right Arm, Patient Position: Sitting, Cuff Size: Normal)   Pulse 68   Temp (!) 97.2 F (36.2 C) (Skin)   Ht 5\' 1"  (1.549 m)   Wt 138 lb 6.4 oz (62.8 kg)   LMP 07/03/2012   BMI 26.15 kg/m   Weight change: @WEIGHTCHANGE @ Height:   Height: 5\' 1"  (154.9 cm)  Ht Readings from Last 3 Encounters:  02/10/19 5\' 1"  (1.549 m)  02/03/18 5\' 1"  (1.549 m)  01/06/17 5\' 1"  (1.549 m)    General appearance: alert, cooperative and appears stated age Head: Normocephalic, without obvious abnormality, atraumatic Neck: no adenopathy, supple, symmetrical, trachea midline and thyroid normal to inspection and palpation Lungs: clear to auscultation bilaterally Cardiovascular: regular rate and rhythm Breasts: normal appearance, no masses or tenderness Abdomen: soft, non-tender; non distended,  no masses,  no organomegaly Extremities: extremities normal, atraumatic, no cyanosis or edema Skin: Skin color, texture, turgor normal. No rashes or lesions Lymph nodes: Cervical, supraclavicular, and axillary nodes normal. No  abnormal inguinal nodes palpated Neurologic: Grossly normal   Pelvic: External genitalia:  no lesions              Urethra:  normal appearing urethra with no masses, tenderness or lesions              Bartholins and Skenes: normal                 Vagina: normal appearing vagina with normal color and discharge, no lesions              Cervix: no lesions               Bimanual Exam:  Uterus:  normal size, contour, position, consistency, mobility, non-tender              Adnexa: no mass, fullness, tenderness               Rectovaginal:  Confirms               Anus:  normal sphincter tone, no lesions  Chaperone was present for exam.  A:  Well Woman with normal exam  P:   No pap this year  Labs with primary  Colonoscopy due in 2021  Mammogram done in 12/19 [facility])  Discussed breast self exam  Discussed calcium and vit D intake

## 2019-02-10 ENCOUNTER — Other Ambulatory Visit: Payer: Self-pay

## 2019-02-10 ENCOUNTER — Encounter: Payer: Self-pay | Admitting: Obstetrics and Gynecology

## 2019-02-10 ENCOUNTER — Ambulatory Visit: Payer: Federal, State, Local not specified - PPO | Admitting: Obstetrics and Gynecology

## 2019-02-10 VITALS — BP 112/80 | HR 68 | Temp 97.2°F | Ht 61.0 in | Wt 138.4 lb

## 2019-02-10 DIAGNOSIS — Z01419 Encounter for gynecological examination (general) (routine) without abnormal findings: Secondary | ICD-10-CM | POA: Diagnosis not present

## 2019-02-10 NOTE — Patient Instructions (Signed)

## 2019-04-28 DIAGNOSIS — K080 Exfoliation of teeth due to systemic causes: Secondary | ICD-10-CM | POA: Diagnosis not present

## 2019-04-29 DIAGNOSIS — M722 Plantar fascial fibromatosis: Secondary | ICD-10-CM | POA: Diagnosis not present

## 2019-05-03 ENCOUNTER — Encounter: Payer: Self-pay | Admitting: Obstetrics and Gynecology

## 2019-05-03 DIAGNOSIS — Z803 Family history of malignant neoplasm of breast: Secondary | ICD-10-CM | POA: Diagnosis not present

## 2019-05-03 DIAGNOSIS — Z1231 Encounter for screening mammogram for malignant neoplasm of breast: Secondary | ICD-10-CM | POA: Diagnosis not present

## 2019-06-16 DIAGNOSIS — L918 Other hypertrophic disorders of the skin: Secondary | ICD-10-CM | POA: Diagnosis not present

## 2019-06-16 DIAGNOSIS — L820 Inflamed seborrheic keratosis: Secondary | ICD-10-CM | POA: Diagnosis not present

## 2019-06-16 DIAGNOSIS — L821 Other seborrheic keratosis: Secondary | ICD-10-CM | POA: Diagnosis not present

## 2019-06-16 DIAGNOSIS — D225 Melanocytic nevi of trunk: Secondary | ICD-10-CM | POA: Diagnosis not present

## 2019-06-16 DIAGNOSIS — Z419 Encounter for procedure for purposes other than remedying health state, unspecified: Secondary | ICD-10-CM | POA: Diagnosis not present

## 2019-06-16 DIAGNOSIS — D224 Melanocytic nevi of scalp and neck: Secondary | ICD-10-CM | POA: Diagnosis not present

## 2019-10-25 DIAGNOSIS — M7732 Calcaneal spur, left foot: Secondary | ICD-10-CM | POA: Diagnosis not present

## 2019-10-25 DIAGNOSIS — M722 Plantar fascial fibromatosis: Secondary | ICD-10-CM | POA: Diagnosis not present

## 2019-11-01 DIAGNOSIS — M722 Plantar fascial fibromatosis: Secondary | ICD-10-CM | POA: Diagnosis not present

## 2019-11-01 DIAGNOSIS — M71572 Other bursitis, not elsewhere classified, left ankle and foot: Secondary | ICD-10-CM | POA: Diagnosis not present

## 2019-11-02 DIAGNOSIS — M722 Plantar fascial fibromatosis: Secondary | ICD-10-CM | POA: Diagnosis not present

## 2019-11-15 DIAGNOSIS — M79641 Pain in right hand: Secondary | ICD-10-CM | POA: Diagnosis not present

## 2019-11-15 DIAGNOSIS — F418 Other specified anxiety disorders: Secondary | ICD-10-CM | POA: Diagnosis not present

## 2019-11-15 DIAGNOSIS — S62642D Nondisplaced fracture of proximal phalanx of right middle finger, subsequent encounter for fracture with routine healing: Secondary | ICD-10-CM | POA: Diagnosis not present

## 2019-11-24 DIAGNOSIS — M79644 Pain in right finger(s): Secondary | ICD-10-CM | POA: Diagnosis not present

## 2019-11-24 DIAGNOSIS — S62612D Displaced fracture of proximal phalanx of right middle finger, subsequent encounter for fracture with routine healing: Secondary | ICD-10-CM | POA: Diagnosis not present

## 2019-12-22 DIAGNOSIS — M79644 Pain in right finger(s): Secondary | ICD-10-CM | POA: Diagnosis not present

## 2019-12-22 DIAGNOSIS — S62612D Displaced fracture of proximal phalanx of right middle finger, subsequent encounter for fracture with routine healing: Secondary | ICD-10-CM | POA: Diagnosis not present

## 2019-12-28 DIAGNOSIS — Z0000 Encounter for general adult medical examination without abnormal findings: Secondary | ICD-10-CM | POA: Diagnosis not present

## 2019-12-28 DIAGNOSIS — Z131 Encounter for screening for diabetes mellitus: Secondary | ICD-10-CM | POA: Diagnosis not present

## 2019-12-28 DIAGNOSIS — Z13220 Encounter for screening for lipoid disorders: Secondary | ICD-10-CM | POA: Diagnosis not present

## 2020-02-11 DIAGNOSIS — M792 Neuralgia and neuritis, unspecified: Secondary | ICD-10-CM | POA: Diagnosis not present

## 2020-02-16 ENCOUNTER — Other Ambulatory Visit: Payer: Self-pay

## 2020-02-16 ENCOUNTER — Ambulatory Visit (INDEPENDENT_AMBULATORY_CARE_PROVIDER_SITE_OTHER): Payer: Federal, State, Local not specified - PPO | Admitting: Obstetrics and Gynecology

## 2020-02-16 ENCOUNTER — Encounter: Payer: Self-pay | Admitting: Obstetrics and Gynecology

## 2020-02-16 VITALS — BP 134/70 | HR 82 | Ht 61.0 in | Wt 138.4 lb

## 2020-02-16 DIAGNOSIS — B002 Herpesviral gingivostomatitis and pharyngotonsillitis: Secondary | ICD-10-CM

## 2020-02-16 DIAGNOSIS — E559 Vitamin D deficiency, unspecified: Secondary | ICD-10-CM | POA: Insufficient documentation

## 2020-02-16 DIAGNOSIS — Z01419 Encounter for gynecological examination (general) (routine) without abnormal findings: Secondary | ICD-10-CM

## 2020-02-16 MED ORDER — VALACYCLOVIR HCL 1 G PO TABS: ORAL_TABLET | ORAL | 1 refills | Status: DC

## 2020-02-16 NOTE — Patient Instructions (Signed)

## 2020-02-16 NOTE — Progress Notes (Signed)
59 y.o. G0P0000 Single White or Caucasian Not Hispanic or Latino female here for annual exam. She just developed a cold sore. She says that it has been years since she had one.  She is retiring next month.     Patient's last menstrual period was 07/03/2012.          Sexually active: No. H/O female partners.  The current method of family planning is post menopausal status.    Exercising: Yes.    Pickle ball, walks dog  Smoker:  no  Health Maintenance: Pap:  01/06/17 WNL HPV Neg, 06/28/13 WNL HPV Neg  History of abnormal Pap:  no MMG:  05/03/2019 report requested from [facility] 02/16/20 BMD:  None  Colonoscopy: 07/18/09 due this year has not done it yet.  TDaP:  Up to date per patient  Gardasil: N/A   reports that she has never smoked. She has never used smokeless tobacco. She reports current alcohol use of about 4.0 standard drinks of alcohol per week. 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 Brother died with parkinson's last year.  Past Medical History:  Diagnosis Date  . Vitamin D deficiency     Past Surgical History:  Procedure Laterality Date  . APPENDECTOMY    . LAPAROSCOPIC APPENDECTOMY N/A 04/07/2016   Procedure: APPENDECTOMY LAPAROSCOPIC;  Surgeon: Jackolyn Confer, MD;  [facility];  Service: General;  Laterality: N/A;    Current Outpatient Medications  Medication Sig Dispense Refill  . Cyanocobalamin (VITAMIN B 12 PO) Take 1 tablet by mouth daily.     Marland Kitchen gabapentin (NEURONTIN) 100 MG capsule Take by mouth.     No current facility-administered medications for this visit.  She takes a vit d supplement, doesn't know the dose.   Family History  Problem Relation Age of Onset  . Hypertension Mother   . Breast cancer Mother 28       radiation  . Hypertension Father   . Thyroid disease Brother        Grave's disease  . Heart disease Brother   . Parkinson's disease Brother     Review of Systems  All other systems reviewed and are  negative.   Exam:   BP 134/70   Pulse 82   Ht 5\' 1"  (1.549 m)   Wt 138 lb 6.4 oz (62.8 kg)   LMP 07/03/2012   SpO2 99%   BMI 26.15 kg/m   Weight change: @WEIGHTCHANGE @ Height:   Height: 5\' 1"  (154.9 cm)  Ht Readings from Last 3 Encounters:  02/16/20 5\' 1"  (1.549 m)  02/10/19 5\' 1"  (1.549 m)  02/03/18 5\' 1"  (1.549 m)    General appearance: alert, cooperative and appears stated age Head: Normocephalic, without obvious abnormality, atraumatic Neck: no adenopathy, supple, symmetrical, trachea midline and thyroid normal to inspection and palpation Lungs: clear to auscultation bilaterally Cardiovascular: regular rate and rhythm Breasts: normal appearance, no masses or tenderness Abdomen: soft, non-tender; non distended,  no masses,  no organomegaly Extremities: extremities normal, atraumatic, no cyanosis or edema Skin: Skin color, texture, turgor normal. No rashes or lesions Lymph nodes: Cervical, supraclavicular, and axillary nodes normal. No abnormal inguinal nodes palpated Neurologic: Grossly normal   Pelvic: External genitalia:  no lesions              Urethra:  normal appearing urethra with no masses, tenderness or lesions              Bartholins and Skenes: normal  Vagina: normal appearing vagina with normal color and discharge, no lesions              Cervix: not well seen patient uncomfortable with pediatric speculum. Normal on palpation.                Bimanual Exam:  Uterus:  normal size, contour, position, consistency, mobility, non-tender              Adnexa: no mass, fullness, tenderness               Rectovaginal: Confirms               Anus:  normal sphincter tone, no lesions  Terence Lux chaperoned for the exam.  A:  Well Woman with normal exam  Vit d def  P:   No Pap this year  She will schedule her colonoscopy and mammogram  Vit d level, other labs with primary DM  Discussed breast self exam  Discussed calcium and vit D  intake

## 2020-02-17 LAB — VITAMIN D 25 HYDROXY (VIT D DEFICIENCY, FRACTURES): Vit D, 25-Hydroxy: 24.1 ng/mL — ABNORMAL LOW (ref 30.0–100.0)

## 2020-02-22 DIAGNOSIS — Z1159 Encounter for screening for other viral diseases: Secondary | ICD-10-CM | POA: Diagnosis not present

## 2020-03-20 DIAGNOSIS — F418 Other specified anxiety disorders: Secondary | ICD-10-CM | POA: Diagnosis not present

## 2020-05-03 DIAGNOSIS — Z1231 Encounter for screening mammogram for malignant neoplasm of breast: Secondary | ICD-10-CM | POA: Diagnosis not present

## 2020-07-11 DIAGNOSIS — G4733 Obstructive sleep apnea (adult) (pediatric): Secondary | ICD-10-CM | POA: Diagnosis not present

## 2020-07-11 DIAGNOSIS — Z803 Family history of malignant neoplasm of breast: Secondary | ICD-10-CM | POA: Diagnosis not present

## 2020-07-11 DIAGNOSIS — M792 Neuralgia and neuritis, unspecified: Secondary | ICD-10-CM | POA: Diagnosis not present

## 2020-07-11 DIAGNOSIS — R922 Inconclusive mammogram: Secondary | ICD-10-CM | POA: Diagnosis not present

## 2020-07-12 ENCOUNTER — Other Ambulatory Visit: Payer: Self-pay | Admitting: Family Medicine

## 2020-07-12 DIAGNOSIS — R922 Inconclusive mammogram: Secondary | ICD-10-CM

## 2020-07-31 DIAGNOSIS — L821 Other seborrheic keratosis: Secondary | ICD-10-CM | POA: Diagnosis not present

## 2020-07-31 DIAGNOSIS — L814 Other melanin hyperpigmentation: Secondary | ICD-10-CM | POA: Diagnosis not present

## 2020-08-16 ENCOUNTER — Ambulatory Visit
Admission: RE | Admit: 2020-08-16 | Discharge: 2020-08-16 | Disposition: A | Discharge status: Home / Self Care | Payer: Federal, State, Local not specified - PPO | Source: Ambulatory Visit | Attending: Family Medicine | Admitting: Family Medicine

## 2020-08-16 DIAGNOSIS — R922 Inconclusive mammogram: Secondary | ICD-10-CM

## 2020-08-16 DIAGNOSIS — N6489 Other specified disorders of breast: Secondary | ICD-10-CM | POA: Diagnosis not present

## 2020-08-16 MED ORDER — GADOBUTROL 1 MMOL/ML IV SOLN
6.0000 mL | Freq: Once | INTRAVENOUS | Status: AC | PRN
  Administered 2020-08-16: 6 mL via INTRAVENOUS

## 2020-08-17 ENCOUNTER — Other Ambulatory Visit: Payer: Federal, State, Local not specified - PPO

## 2020-09-14 DIAGNOSIS — Z1211 Encounter for screening for malignant neoplasm of colon: Secondary | ICD-10-CM | POA: Diagnosis not present

## 2020-09-18 DIAGNOSIS — M8589 Other specified disorders of bone density and structure, multiple sites: Secondary | ICD-10-CM | POA: Diagnosis not present

## 2020-09-18 DIAGNOSIS — M810 Age-related osteoporosis without current pathological fracture: Secondary | ICD-10-CM | POA: Diagnosis not present

## 2021-01-15 DIAGNOSIS — G4733 Obstructive sleep apnea (adult) (pediatric): Secondary | ICD-10-CM | POA: Diagnosis not present

## 2021-01-15 DIAGNOSIS — Z0000 Encounter for general adult medical examination without abnormal findings: Secondary | ICD-10-CM | POA: Diagnosis not present

## 2021-01-15 DIAGNOSIS — E559 Vitamin D deficiency, unspecified: Secondary | ICD-10-CM | POA: Diagnosis not present

## 2021-01-15 DIAGNOSIS — Z13220 Encounter for screening for lipoid disorders: Secondary | ICD-10-CM | POA: Diagnosis not present

## 2021-01-15 DIAGNOSIS — M810 Age-related osteoporosis without current pathological fracture: Secondary | ICD-10-CM | POA: Diagnosis not present

## 2021-01-15 DIAGNOSIS — Z23 Encounter for immunization: Secondary | ICD-10-CM | POA: Diagnosis not present

## 2021-02-26 ENCOUNTER — Other Ambulatory Visit: Payer: Self-pay

## 2021-02-26 ENCOUNTER — Other Ambulatory Visit (HOSPITAL_COMMUNITY)
Admission: RE | Admit: 2021-02-26 | Discharge: 2021-02-26 | Disposition: A | Discharge status: Home / Self Care | Payer: Federal, State, Local not specified - PPO | Source: Ambulatory Visit | Attending: Obstetrics and Gynecology | Admitting: Obstetrics and Gynecology

## 2021-02-26 ENCOUNTER — Encounter: Payer: Self-pay | Admitting: Obstetrics and Gynecology

## 2021-02-26 ENCOUNTER — Ambulatory Visit (INDEPENDENT_AMBULATORY_CARE_PROVIDER_SITE_OTHER): Payer: Federal, State, Local not specified - PPO | Admitting: Obstetrics and Gynecology

## 2021-02-26 VITALS — BP 104/67 | HR 72 | Ht 61.0 in | Wt 127.0 lb

## 2021-02-26 DIAGNOSIS — Z01419 Encounter for gynecological examination (general) (routine) without abnormal findings: Secondary | ICD-10-CM | POA: Diagnosis not present

## 2021-02-26 DIAGNOSIS — Z124 Encounter for screening for malignant neoplasm of cervix: Secondary | ICD-10-CM

## 2021-02-26 DIAGNOSIS — Z8739 Personal history of other diseases of the musculoskeletal system and connective tissue: Secondary | ICD-10-CM

## 2021-02-26 DIAGNOSIS — Z9189 Other specified personal risk factors, not elsewhere classified: Secondary | ICD-10-CM | POA: Diagnosis not present

## 2021-02-26 NOTE — Patient Instructions (Signed)

## 2021-02-26 NOTE — Progress Notes (Signed)
60 y.o. G0P0000 Single White or Caucasian Not Hispanic or Latino female here for annual exam.  No vaginal bleeding. No bowel or bladder c/o. Not sexually active.    She was told her bone density was abnormal in her spine. H/o vit d def, currently taking a multivit. Was previously on 2000 IU a day of vit d  Patient's last menstrual period was 07/03/2012.          Sexually active: No.  The current method of family planning is post menopausal status.    Exercising: Yes.     Walking and bowflex Smoker:  no  Health Maintenance: Pap:  01/06/17 WNL HPV Neg, 06/28/13 WNL HPV Neg  History of abnormal Pap:  no [place] MR  02/16/21 Bi-rads 1 neg, cat d Mammogram 05/03/20: negative, TC breast cancer risk 29.8% BMD:  2022   [facility], was told it wasn't normal in her spine. Primary discussed it with her, planning a f/u DEXA in a year.   Colonoscopy: 2022 normal  TDaP:  up to date per patient  Gardasil: na   reports that she has never smoked. She has never used smokeless tobacco. She reports current alcohol use of about 4.0 standard drinks per week. She reports that she does not use drugs. She retired in 4/22.   Past Medical History:  Diagnosis Date   Vitamin D deficiency     Past Surgical History:  Procedure Laterality Date   APPENDECTOMY     LAPAROSCOPIC APPENDECTOMY N/A 04/07/2016   Procedure: APPENDECTOMY LAPAROSCOPIC;  Surgeon: Jackolyn Confer, MD;  Location: [facility];  Service: General;  Laterality: N/A;    Current Outpatient Medications  Medication Sig Dispense Refill   Cyanocobalamin (VITAMIN B 12 PO) Take 1 tablet by mouth daily.      No current facility-administered medications for this visit.    Family History  Problem Relation Age of Onset   Hypertension Mother    Breast cancer Mother 76       radiation   Hypertension Father    Thyroid disease Brother        Grave's disease   Heart disease Brother    Parkinson's disease Brother     Review of Systems  All other systems  reviewed and are negative.  Exam:   BP 104/67   Pulse 72   Ht 5\' 1"  (1.549 m)   Wt 127 lb (57.6 kg)   LMP 07/03/2012   SpO2 100%   BMI 24.00 kg/m   Weight change: @WEIGHTCHANGE @ Height:   Height: 5\' 1"  (154.9 cm)  Ht Readings from Last 3 Encounters:  02/26/21 5\' 1"  (1.549 m)  02/16/20 5\' 1"  (1.549 m)  02/10/19 5\' 1"  (1.549 m)    General appearance: alert, cooperative and appears stated age Head: Normocephalic, without obvious abnormality, atraumatic Neck: no adenopathy, supple, symmetrical, trachea midline and thyroid normal to inspection and palpation Lungs: clear to auscultation bilaterally Cardiovascular: regular rate and rhythm Breasts: normal appearance, no masses or tenderness Abdomen: soft, non-tender; non distended,  no masses,  no organomegaly Extremities: extremities normal, atraumatic, no cyanosis or edema Skin: Skin color, texture, turgor normal. No rashes or lesions Lymph nodes: Cervical, supraclavicular, and axillary nodes normal. No abnormal inguinal nodes palpated Neurologic: Grossly normal   Pelvic: External genitalia:  no lesions              Urethra:  normal appearing urethra with no masses, tenderness or lesions  Bartholins and Skenes: normal                 Vagina: normal appearing vagina with normal color and discharge, no lesions              Cervix:  deep in vagina, only anterior lip seen, unable to open speculum further (adolescent speculum)               Bimanual Exam:  Uterus:  normal size, contour, position, consistency, mobility, non-tender              Adnexa: no mass, fullness, tenderness               Rectovaginal: Confirms               Anus:  normal sphincter tone, no lesions  Gae Dry chaperoned for the exam.  1. Well woman exam Discussed breast self exam Mammogram due in 12/22 Colonoscopy UTD  2. At increased risk of breast cancer Discussed recommendations for MRI's (also discussed risks from contrast) Continue  yearly mammograms  3. History of osteoporosis Discussed her DEXA Information on osteoporosis from UTD given.  Discussed calcium and vit d intake She is exercising regularly.  Explained that treatment is recommended  4. Screening for cervical cancer - Cytology - PAP

## 2021-02-27 ENCOUNTER — Encounter: Payer: Self-pay | Admitting: Obstetrics and Gynecology

## 2021-02-27 LAB — CYTOLOGY - PAP
Comment: NEGATIVE
Diagnosis: NEGATIVE
High risk HPV: NEGATIVE

## 2021-05-04 DIAGNOSIS — Z1231 Encounter for screening mammogram for malignant neoplasm of breast: Secondary | ICD-10-CM | POA: Diagnosis not present

## 2021-05-07 ENCOUNTER — Encounter: Payer: Self-pay | Admitting: Obstetrics and Gynecology

## 2021-05-23 IMAGING — MR MR BREAST BILAT WO/W CM
8 of 12 series · 34 of 48 positions shown · IV contrast (6ml Gadavist)
Comparison: Prior mammograms

CLINICAL DATA: 60-year-old female with family history of breast
cancer and very dense breast tissue.

LABS:  None performed today
EXAM:
BILATERAL BREAST MRI WITH AND WITHOUT CONTRAST
TECHNIQUE: Multiplanar, multisequence MR images of both breasts were obtained
prior to and following the intravenous administration of 6 ml of
Gadavist

[Series 2: t2_tirm_tra ipat (a-p) · axial · 3.0mm · 0.64mm/px · 1 of 55 slices shown]
[im 1/55]
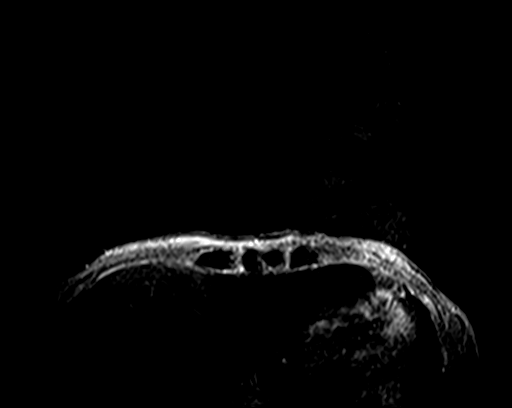

[Series 3: fl3d pre-cm no · axial · non-contrast · 1.2mm · 0.83mm/px · z∈[-89,+83]mm · 5 of 144 slices shown]
[im 1/144]
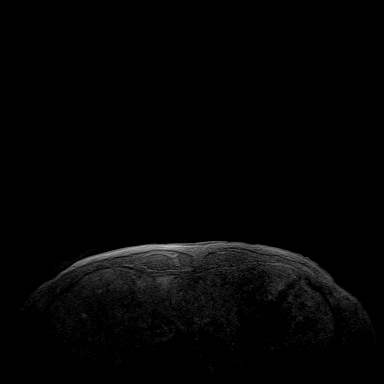
[im 36/144]
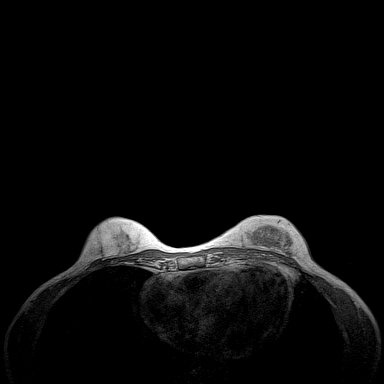
[im 72/144]
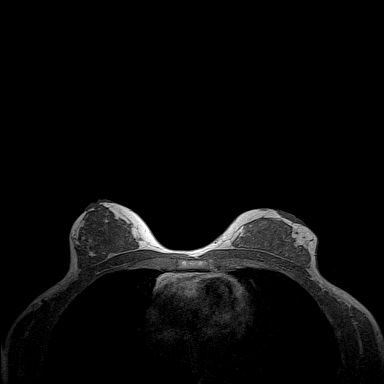
[im 108/144]
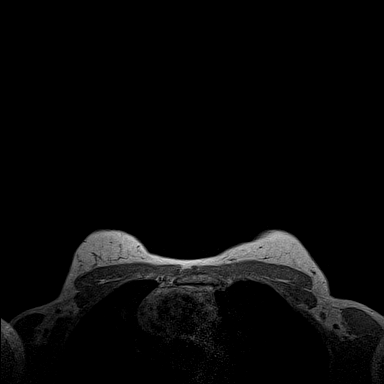
[im 144/144]
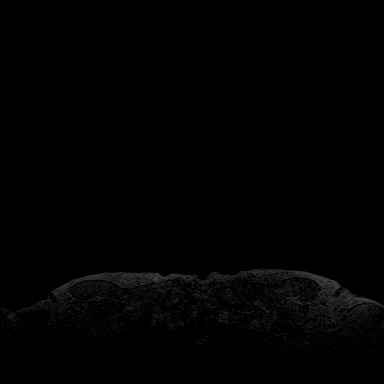

[Series 4: fl3d pre-cm · axial · non-contrast · 1.2mm · 0.83mm/px · z∈[-89,+83]mm · 5 of 144 slices shown]
[im 1/144]
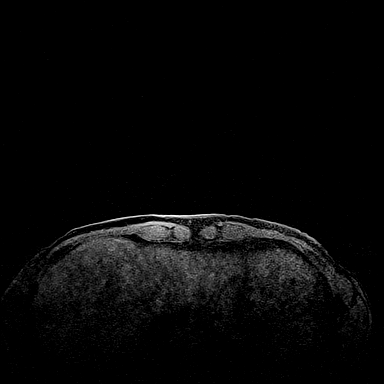
[im 36/144]
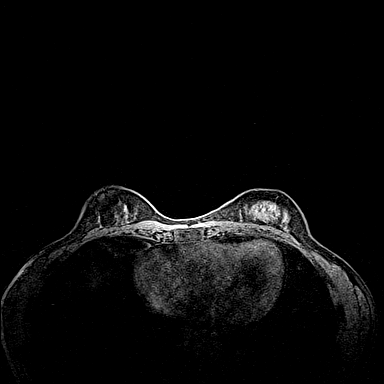
[im 72/144]
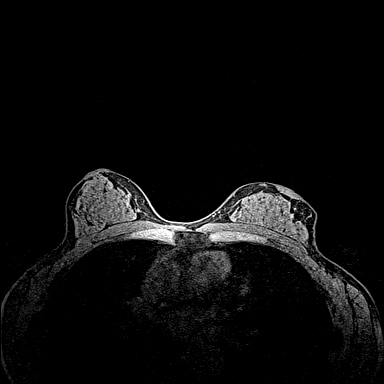
[im 108/144]
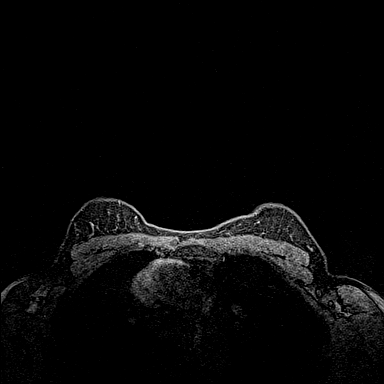
[im 144/144]
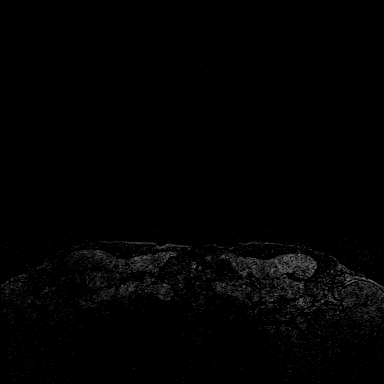

[Series 5: fl3d post-cm 20 · axial · 1.2mm · 0.83mm/px · z∈[-89,+83]mm · 5 of 144 slices shown (1 of 3)]
[im 1/144]
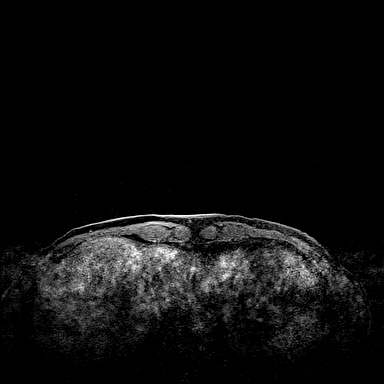
[im 36/144]
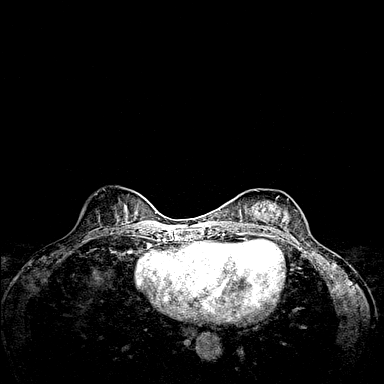
[im 72/144]
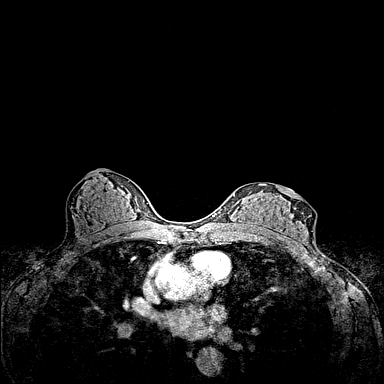
[im 108/144]
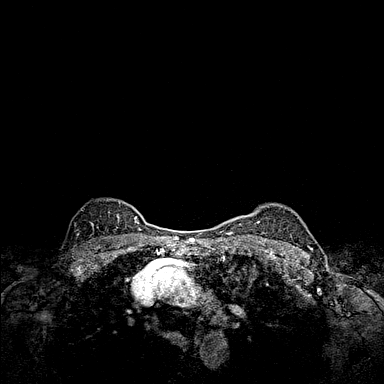
[im 144/144]
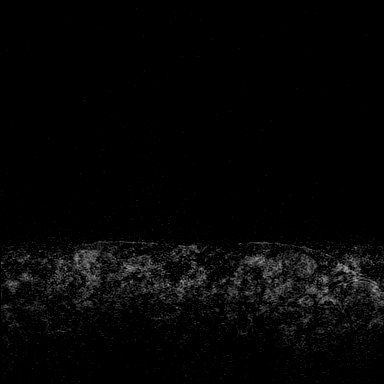

[Series 6: fl3d post-cm 20 · axial · 1.2mm · 0.83mm/px · z∈[-89,+83]mm · 5 of 144 slices shown (2 of 3)]
[im 1/144]
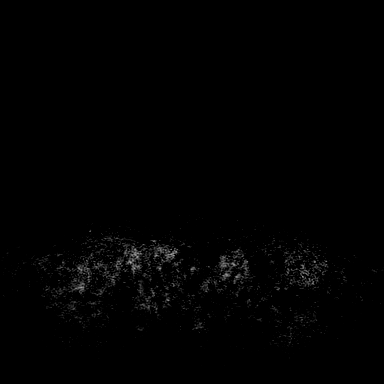
[im 36/144]
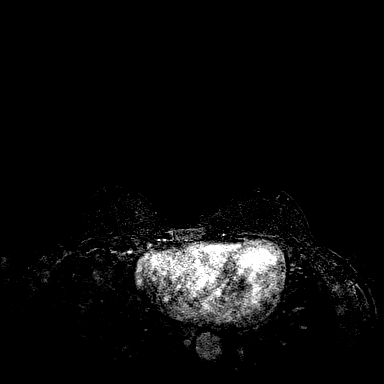
[im 72/144]
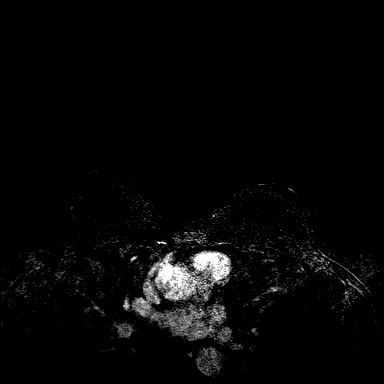
[im 108/144]
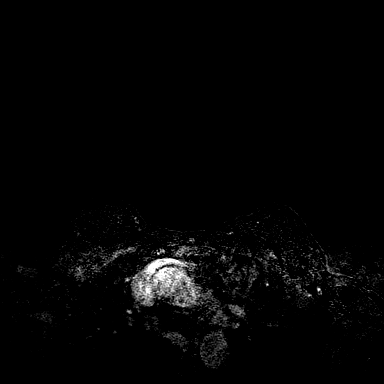
[im 144/144]
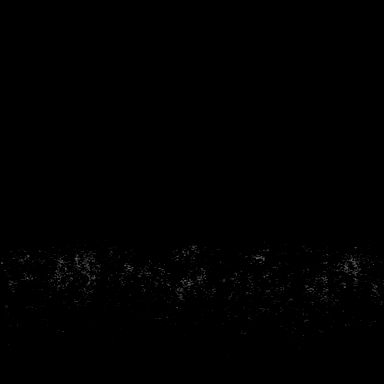

[Series 7: fl3d post-cm 20 · axial · 172.8mm · 0.83mm/px · 1 of 1 slices shown (3 of 3)]
[im 1/1]
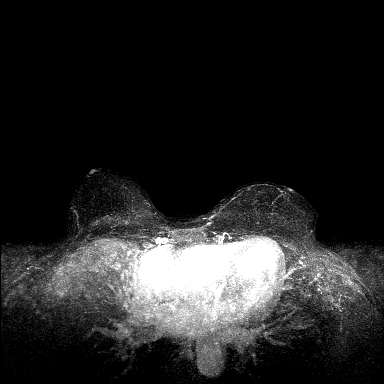

[Series 8: fl3d post-cm 3min · axial · 1.2mm · 0.83mm/px · z∈[-89,+83]mm · 6 of 144 slices shown]
[im 1/144]
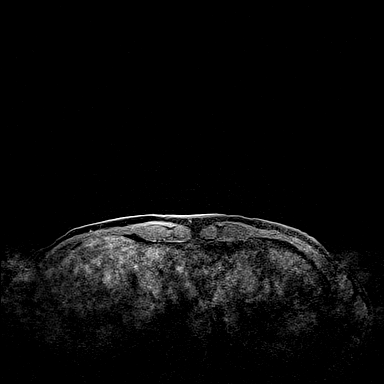
[im 29/144]
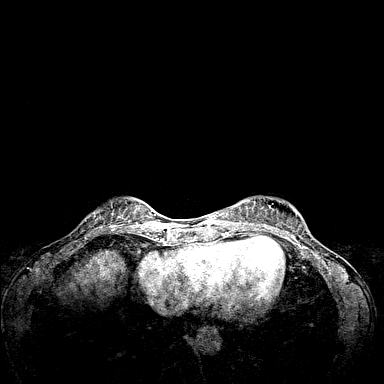
[im 58/144]
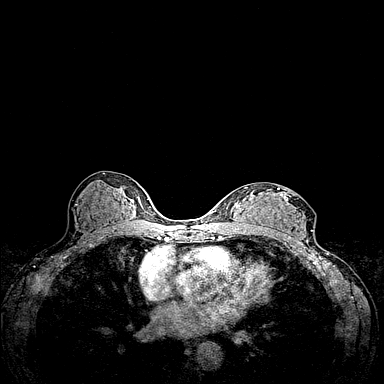
[im 86/144]
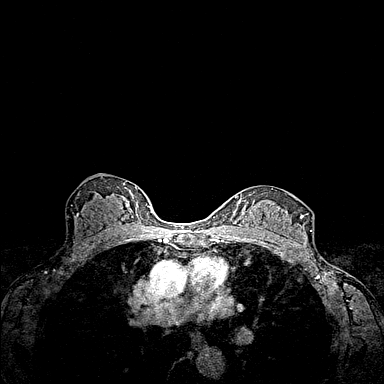
[im 115/144]
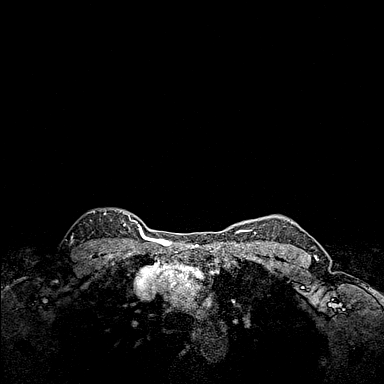
[im 144/144]
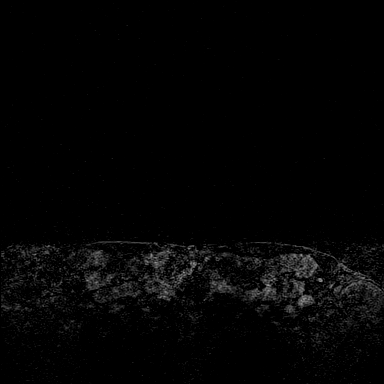

[Series 9: fl3d post-cm 3min_sub · axial · 1.2mm · 0.83mm/px · z∈[-89,+83]mm · 6 of 144 slices shown]
[im 1/144]
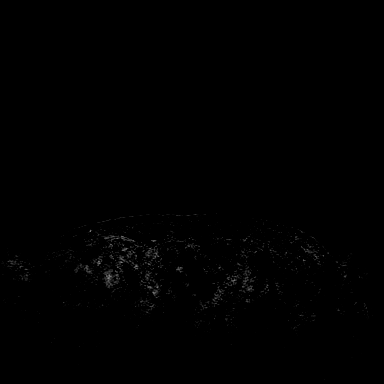
[im 29/144]
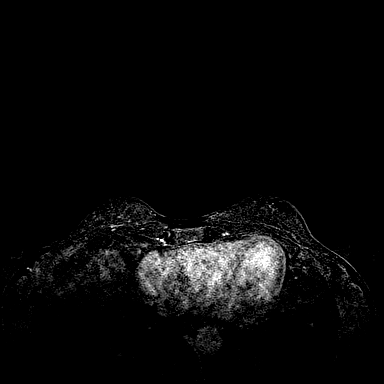
[im 58/144]
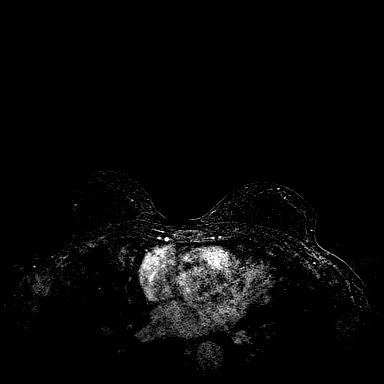
[im 86/144]
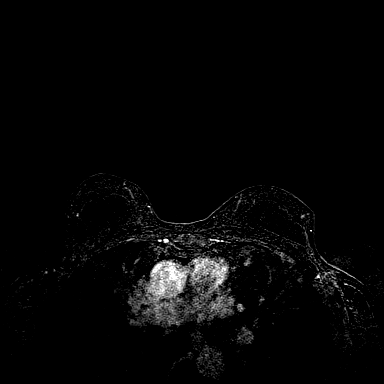
[im 115/144]
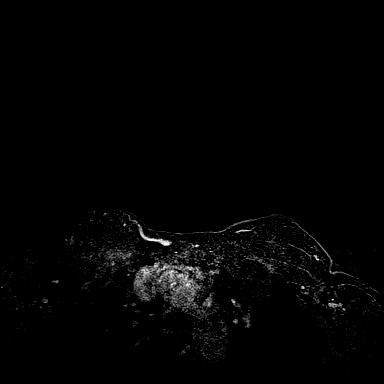
[im 144/144]
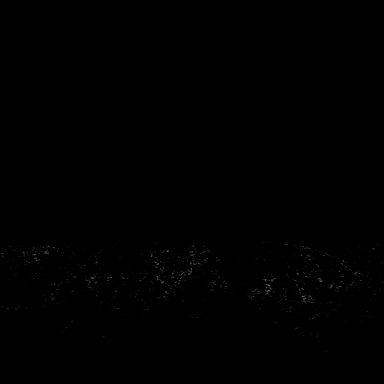

[34 of 48 positions shown; findings below may reference images not displayed]

Three-dimensional MR images were rendered by post-processing of the
original MR data on an independent workstation. The
three-dimensional MR images were interpreted, and findings are
reported in the following complete MRI report for this study. Three
dimensional images were evaluated at the independent interpreting
workstation using the DynaCAD thin client.
FINDINGS: Breast composition: d. Extreme fibroglandular tissue.

Background parenchymal enhancement: Minimal

Right breast: No mass or abnormal enhancement.

Left breast: No mass or abnormal enhancement.

Lymph nodes: No abnormal appearing lymph nodes.

Ancillary findings:  None.
IMPRESSION: No MR evidence of breast malignancy.

RECOMMENDATION:
Bilateral screening mammogram in April 2021 to resume annual
mammogram schedule.

Bilateral screening breast MRI in 1 year as clinically indicated.

BI-RADS CATEGORY  1: Negative.

## 2021-08-09 DIAGNOSIS — M7989 Other specified soft tissue disorders: Secondary | ICD-10-CM | POA: Diagnosis not present

## 2021-08-09 DIAGNOSIS — G4733 Obstructive sleep apnea (adult) (pediatric): Secondary | ICD-10-CM | POA: Diagnosis not present

## 2021-08-09 DIAGNOSIS — M79672 Pain in left foot: Secondary | ICD-10-CM | POA: Diagnosis not present

## 2021-10-29 DIAGNOSIS — Z03818 Encounter for observation for suspected exposure to other biological agents ruled out: Secondary | ICD-10-CM | POA: Diagnosis not present

## 2021-10-29 DIAGNOSIS — R490 Dysphonia: Secondary | ICD-10-CM | POA: Diagnosis not present

## 2021-10-29 DIAGNOSIS — J029 Acute pharyngitis, unspecified: Secondary | ICD-10-CM | POA: Diagnosis not present

## 2022-01-03 DIAGNOSIS — G4733 Obstructive sleep apnea (adult) (pediatric): Secondary | ICD-10-CM | POA: Diagnosis not present

## 2022-02-15 DIAGNOSIS — M810 Age-related osteoporosis without current pathological fracture: Secondary | ICD-10-CM | POA: Diagnosis not present

## 2022-02-15 DIAGNOSIS — E559 Vitamin D deficiency, unspecified: Secondary | ICD-10-CM | POA: Diagnosis not present

## 2022-02-15 DIAGNOSIS — E7800 Pure hypercholesterolemia, unspecified: Secondary | ICD-10-CM | POA: Diagnosis not present

## 2022-02-15 DIAGNOSIS — Z0000 Encounter for general adult medical examination without abnormal findings: Secondary | ICD-10-CM | POA: Diagnosis not present

## 2022-02-15 DIAGNOSIS — Z23 Encounter for immunization: Secondary | ICD-10-CM | POA: Diagnosis not present

## 2022-02-18 ENCOUNTER — Encounter: Payer: Self-pay | Admitting: Neurology

## 2022-02-25 NOTE — Progress Notes (Signed)
61 y.o. G0P0000 Single White or Caucasian Not Hispanic or Latino female here for annual exam.  No vaginal bleeding. Not sexually active.  No bowel or bladder c/o.     Patient's last menstrual period was 07/03/2012.          Sexually active: No.  The current method of family planning is post menopausal status.    Exercising: Yes.     Walking hiking and bowflex  Smoker:  no  Health Maintenance: Pap:  02/26/21 WNL HR HPV Normal; 01/06/17 WNL HPV Neg, 06/28/13 WNL HPV Neg  History of abnormal Pap:  no MMG:  05/07/21 Bi-rads 1 neg  Breast MR 08/16/20, negative.  BMD:   09/18/20 Osteoporosis, with primary. Not started on medication. Will f/u with primary  Colonoscopy:  2022 normal, f/u 10 year  TDaP:  up to date per patient  Gardasil: n/a   reports that she has never smoked. She has never used smokeless tobacco. She reports current alcohol use of about 4.0 standard drinks of alcohol per week. She reports that she does not use drugs. Retired.   Past Medical History:  Diagnosis Date   Vitamin D deficiency     Past Surgical History:  Procedure Laterality Date   APPENDECTOMY     LAPAROSCOPIC APPENDECTOMY N/A 04/07/2016   Procedure: APPENDECTOMY LAPAROSCOPIC;  Surgeon: Jackolyn Confer, MD;  Location: [facility];  Service: General;  Laterality: N/A;    Current Outpatient Medications  Medication Sig Dispense Refill   Multiple Vitamin (MULTI-VITAMIN DAILY PO) Take by mouth.     Omega-3 Fatty Acids (OMEGA 3 PO) Take by mouth.     VITAMIN D PO Take by mouth.     No current facility-administered medications for this visit.    Family History  Problem Relation Age of Onset   Hypertension Mother    Breast cancer Mother 13       radiation   Hypertension Father    Thyroid disease Brother        Grave's disease   Heart disease Brother    Parkinson's disease Brother     Review of Systems  All other systems reviewed and are negative.   Exam:   BP 100/72   Pulse 62   Ht 5' 0.63" (1.54 m)    Wt 131 lb (59.4 kg)   LMP 07/03/2012   SpO2 100%   BMI 25.06 kg/m   Weight change: '@WEIGHTCHANGE'$ @ Height:   Height: 5' 0.63" (154 cm)  Ht Readings from Last 3 Encounters:  02/28/22 5' 0.63" (1.54 m)  02/26/21 '5\' 1"'$  (1.549 m)  02/16/20 '5\' 1"'$  (1.549 m)    General appearance: alert, cooperative and appears stated age Head: Normocephalic, without obvious abnormality, atraumatic Neck: no adenopathy, supple, symmetrical, trachea midline and thyroid normal to inspection and palpation Lungs: clear to auscultation bilaterally Cardiovascular: regular rate and rhythm Breasts: normal appearance, no masses or tenderness Abdomen: soft, non-tender; non distended,  no masses,  no organomegaly Extremities: extremities normal, atraumatic, no cyanosis or edema Skin: Skin color, texture, turgor normal. No rashes or lesions Lymph nodes: Cervical, supraclavicular, and axillary nodes normal. No abnormal inguinal nodes palpated Neurologic: Grossly normal   Pelvic: External genitalia:  no lesions              Urethra:  normal appearing urethra with no masses, tenderness or lesions              Bartholins and Skenes: normal  Vagina: atrophic appearing vagina with normal color and discharge, no lesions              Cervix:  only anterior lip seen, no lesions               Bimanual Exam:  Uterus:  normal size, contour, position, consistency, mobility, non-tender              Adnexa: no mass, fullness, tenderness               Rectovaginal: Confirms               Anus:  normal sphincter tone, no lesions  Gae Dry, CMA chaperoned for the exam.   1. Well woman exam Discussed breast self exam Discussed calcium and vit D intake Labs with primary Colonoscopy UTD  2. At increased risk of breast cancer Dense breast  3. History of osteoporosis On calcium and vit d Following with primary, will have a f/u DEXA next year

## 2022-02-28 ENCOUNTER — Ambulatory Visit (INDEPENDENT_AMBULATORY_CARE_PROVIDER_SITE_OTHER): Payer: Federal, State, Local not specified - PPO | Admitting: Obstetrics and Gynecology

## 2022-02-28 ENCOUNTER — Encounter: Payer: Self-pay | Admitting: Obstetrics and Gynecology

## 2022-02-28 VITALS — BP 100/72 | HR 62 | Ht 60.63 in | Wt 131.0 lb

## 2022-02-28 DIAGNOSIS — Z9189 Other specified personal risk factors, not elsewhere classified: Secondary | ICD-10-CM | POA: Diagnosis not present

## 2022-02-28 DIAGNOSIS — Z8739 Personal history of other diseases of the musculoskeletal system and connective tissue: Secondary | ICD-10-CM | POA: Diagnosis not present

## 2022-02-28 DIAGNOSIS — Z01419 Encounter for gynecological examination (general) (routine) without abnormal findings: Secondary | ICD-10-CM

## 2022-03-04 ENCOUNTER — Telehealth: Payer: Self-pay

## 2022-03-04 NOTE — Telephone Encounter (Signed)
Patient called in voice mail. I called her back and per DPR access note on file I left detailed message in her voice mail with dates/times.

## 2022-03-04 NOTE — Telephone Encounter (Signed)
I spoke with [employer]. The protocol will not allow her to schedule the bilateral breast u/s at same day as screening mammogram.  Appt scheduled 05/23/22 at noon for her screening mammogram. Appt scheduled 05/24/22 at 9:15am bilateral breast u.s. Patient advised to check in 15 minutes early.

## 2022-03-04 NOTE — Telephone Encounter (Signed)
Left message for patient to call.

## 2022-03-04 NOTE — Telephone Encounter (Signed)
Salvadore Dom, MD  [place] Triage This patient has an elevated risk of breast cancer and dense breasts, she declines breast MRI but would like to do Bilateral ultrasounds with her mammogram in 12/23 (at [facility]  Please order the u/s for dx dense breasts

## 2022-03-05 NOTE — Telephone Encounter (Signed)
Patient called back stating she received the message below.

## 2022-04-19 ENCOUNTER — Encounter: Payer: Self-pay | Admitting: Neurology

## 2022-04-19 ENCOUNTER — Ambulatory Visit: Payer: Federal, State, Local not specified - PPO | Admitting: Neurology

## 2022-04-19 VITALS — BP 120/83 | HR 82 | Ht 60.63 in | Wt 132.0 lb

## 2022-04-19 DIAGNOSIS — G500 Trigeminal neuralgia: Secondary | ICD-10-CM | POA: Diagnosis not present

## 2022-04-19 NOTE — Progress Notes (Signed)
[facility] Clinic Note - Initial Visit   Date: 04/19/2022   Crystal Mcguire MRN: [id number] DOB: 05-27-1960   Dear Dr. Doristine Bosworth:  Thank you for your kind referral of Crystal Mcguire for consultation of left trigeminal neuralgia. Although her history is well known to you, please allow [place] to reiterate it for the purpose of our medical record. The patient was accompanied to the clinic by self.     Crystal Mcguire is a 61 y.o. right-handed female presenting for evaluation of left trigeminal neuralgia.   IMPRESSION/PLAN: Left trigeminal neuralgia (~2021). She has exacerbation of pain about 3-4 imes per year and manages this with gabapentin 100-'300mg'$  as needed, which she can continue to do.  She had many questions which were addressed and explained that there is not curative treatment, as management is supportive.  We can check MRI brain to look for vascular loop, but given that her pain is not refractory, management would remain unchanged.  All questions were answered and she opted not to proceed with imaging and stay on her current medication regimen.  Return to clinic as needed  ------------------------------------------------------------- History of present illness: Starting around 2021, she began having left facial pain, sometimes shooting in the roof of the mouth, other times, she felt electrical shock moving across the face.  It is worse with talking, brushing teeth, or laying on that side. Pain occurs throughout the year in spells.  She takes gabapentin '100mg'$ -'300mg'$  at bedtime for about three weeks which usually controls symptoms for a few weeks.  In between spells, she still has a very low grade discomfort on the left side of the face.  She denies facial weakness or eye pain.  She has occasional headaches.     Out-side paper records, electronic medical record, and images have been reviewed where available and summarized as:  None    Past  Medical History:  Diagnosis Date   Vitamin D deficiency     Past Surgical History:  Procedure Laterality Date   APPENDECTOMY     LAPAROSCOPIC APPENDECTOMY N/A 04/07/2016   Procedure: APPENDECTOMY LAPAROSCOPIC;  Surgeon: Jackolyn Confer, MD;  Location: [facility];  Service: General;  Laterality: N/A;     Medications:  Outpatient Encounter Medications as of 04/19/2022  Medication Sig   Multiple Vitamin (MULTI-VITAMIN DAILY PO) Take by mouth.   Omega-3 Fatty Acids (OMEGA 3 PO) Take by mouth.   VITAMIN D PO Take by mouth.   No facility-administered encounter medications on file as of 04/19/2022.    Allergies: No Known Allergies  Family History: Family History  Problem Relation Age of Onset   Hypertension Mother    Breast cancer Mother 28       radiation   Heart failure Mother    Hypertension Father    Heart Problems Father    Thyroid disease Brother        Grave's disease   Heart disease Brother    Parkinson's disease Brother     Social History: Social History   Tobacco Use   Smoking status: Never   Smokeless tobacco: Never  Vaping Use   Vaping Use: Never used  Substance Use Topics   Alcohol use: Yes    Alcohol/week: 4.0 standard drinks of alcohol    Types: 4 Standard drinks or equivalent per week    Comment: Occasional glass of wine   Drug use: No   Social History   Social History Narrative   Are you right handed or left handed? Right Handed  Are you currently employed ? Retired    What is your current occupation?   Do you live at home alone? Yes   Who lives with you? My dog only    What type of home do you live in: 1 story or 2 story? Lives in a two story home         Vital Signs:  BP 120/83   Pulse 82   Ht 5' 0.63" (1.54 m)   Wt 132 lb (59.9 kg)   LMP 07/03/2012   SpO2 99%   BMI 25.25 kg/m   Neurological Exam: MENTAL STATUS including orientation to time, place, person, recent and remote memory, attention span and concentration, language, and  fund of knowledge is normal.  Speech is not dysarthric.  CRANIAL NERVES: II:  No visual field defects.    III-IV-VI: Pupils equal round and reactive to light.  Normal conjugate, extra-ocular eye movements in all directions of gaze.  No nystagmus.  No ptosis.   V:  Normal facial sensation.    VII:  Normal facial symmetry and movements.   VIII:  Normal hearing and vestibular function.   IX-X:  Normal palatal movement.   XI:  Normal shoulder shrug and head rotation.   XII:  Normal tongue strength and range of motion, no deviation or fasciculation.  MOTOR:  Motor strength is 5/5 throughout.  No atrophy, fasciculations or abnormal movements.  No pronator drift.   MSRs:                                           Right        Left brachioradialis 2+  2+  biceps 2+  2+  triceps 2+  2+  patellar 2+  2+  ankle jerk 2+  2+   SENSORY:  Normal and symmetric perception of light touch, temperature and vibration  COORDINATION/GAIT: Normal finger-to- nose-finger.  Intact rapid alternating movements bilaterally.  Gait narrow based and stable. Tandem and stressed gait intact.     Thank you for allowing me to participate in patient's care.  If I can answer any additional questions, I would be pleased to do so.    Sincerely,    Joelynn Dust K. Posey Pronto, DO

## 2022-05-24 DIAGNOSIS — Z1231 Encounter for screening mammogram for malignant neoplasm of breast: Secondary | ICD-10-CM | POA: Diagnosis not present

## 2022-05-24 DIAGNOSIS — Z803 Family history of malignant neoplasm of breast: Secondary | ICD-10-CM | POA: Diagnosis not present

## 2022-05-27 ENCOUNTER — Encounter: Payer: Self-pay | Admitting: Obstetrics and Gynecology

## 2022-05-30 DIAGNOSIS — D225 Melanocytic nevi of trunk: Secondary | ICD-10-CM | POA: Diagnosis not present

## 2022-05-30 DIAGNOSIS — L814 Other melanin hyperpigmentation: Secondary | ICD-10-CM | POA: Diagnosis not present

## 2022-05-30 DIAGNOSIS — D2262 Melanocytic nevi of left upper limb, including shoulder: Secondary | ICD-10-CM | POA: Diagnosis not present

## 2022-05-30 DIAGNOSIS — D2261 Melanocytic nevi of right upper limb, including shoulder: Secondary | ICD-10-CM | POA: Diagnosis not present

## 2022-05-30 DIAGNOSIS — L820 Inflamed seborrheic keratosis: Secondary | ICD-10-CM | POA: Diagnosis not present

## 2022-08-07 DIAGNOSIS — G4733 Obstructive sleep apnea (adult) (pediatric): Secondary | ICD-10-CM | POA: Diagnosis not present

## 2022-08-07 DIAGNOSIS — R0789 Other chest pain: Secondary | ICD-10-CM | POA: Diagnosis not present

## 2022-08-07 DIAGNOSIS — N6311 Unspecified lump in the right breast, upper outer quadrant: Secondary | ICD-10-CM | POA: Diagnosis not present

## 2022-08-12 ENCOUNTER — Other Ambulatory Visit: Payer: Self-pay | Admitting: Internal Medicine

## 2022-08-12 DIAGNOSIS — Z803 Family history of malignant neoplasm of breast: Secondary | ICD-10-CM

## 2022-08-12 DIAGNOSIS — R922 Inconclusive mammogram: Secondary | ICD-10-CM

## 2022-08-12 DIAGNOSIS — N6312 Unspecified lump in the right breast, upper inner quadrant: Secondary | ICD-10-CM

## 2022-08-22 DIAGNOSIS — G4733 Obstructive sleep apnea (adult) (pediatric): Secondary | ICD-10-CM | POA: Diagnosis not present

## 2022-09-03 ENCOUNTER — Ambulatory Visit
Admission: RE | Admit: 2022-09-03 | Discharge: 2022-09-03 | Disposition: A | Discharge status: Home / Self Care | Payer: Federal, State, Local not specified - PPO | Source: Ambulatory Visit | Attending: Internal Medicine | Admitting: Internal Medicine

## 2022-09-03 DIAGNOSIS — Z803 Family history of malignant neoplasm of breast: Secondary | ICD-10-CM

## 2022-09-03 DIAGNOSIS — N6312 Unspecified lump in the right breast, upper inner quadrant: Secondary | ICD-10-CM

## 2022-09-03 DIAGNOSIS — R9230 Dense breasts, unspecified: Secondary | ICD-10-CM

## 2022-09-03 DIAGNOSIS — N644 Mastodynia: Secondary | ICD-10-CM | POA: Diagnosis not present

## 2022-09-03 MED ORDER — GADOPICLENOL 0.5 MMOL/ML IV SOLN
6.0000 mL | Freq: Once | INTRAVENOUS | Status: AC | PRN
  Administered 2022-09-03: 6 mL via INTRAVENOUS
